# Patient Record
Sex: Female | Born: 1990 | Marital: Single | Smoking: Never smoker
Health system: Southern US, Community
[De-identification: ages and names within clinical notes are randomized; demographics above are authoritative.]

## PROBLEM LIST (undated history)

## (undated) DIAGNOSIS — K219 Gastro-esophageal reflux disease without esophagitis: Secondary | ICD-10-CM

## (undated) DIAGNOSIS — J45909 Unspecified asthma, uncomplicated: Secondary | ICD-10-CM

## (undated) DIAGNOSIS — I10 Essential (primary) hypertension: Secondary | ICD-10-CM

## (undated) HISTORY — DX: Gastro-esophageal reflux disease without esophagitis: K21.9

## (undated) HISTORY — DX: Essential (primary) hypertension: I10

## (undated) HISTORY — DX: Unspecified asthma, uncomplicated: J45.909

## (undated) HISTORY — DX: Morbid (severe) obesity due to excess calories: E66.01

---

## 2013-05-06 ENCOUNTER — Ambulatory Visit (INDEPENDENT_AMBULATORY_CARE_PROVIDER_SITE_OTHER): Payer: No Typology Code available for payment source | Admitting: Surgery

## 2013-05-06 ENCOUNTER — Encounter (INDEPENDENT_AMBULATORY_CARE_PROVIDER_SITE_OTHER): Payer: Self-pay | Admitting: Surgery

## 2013-05-06 VITALS — BP 130/76 | HR 84 | Temp 98.8°F | Resp 15 | Ht 67.0 in | Wt >= 6400 oz

## 2013-05-06 DIAGNOSIS — K219 Gastro-esophageal reflux disease without esophagitis: Secondary | ICD-10-CM | POA: Insufficient documentation

## 2013-05-06 DIAGNOSIS — I10 Essential (primary) hypertension: Secondary | ICD-10-CM | POA: Insufficient documentation

## 2013-05-06 NOTE — Progress Notes (Signed)
Chief Complaint:  Morbid obesity lifelong BMI 65  History of Present Illness:  Debra Walters is an 23 y.o. female college student currently up to protect with lifelong history of obesity. Her parents were obese and she has been obese since a child. She has had problems with the dad asthma, GERD, and hypertension. She currently takes Prilosec, albuterol, and  Ibuprofen. She has tried numerous attempts at weight loss including women's health and various [facility] programs. She's tried to low calorie diets, self-imposed fasts, high protein diets. She is prepared to go to move forward with laparoscopic Roux-en-Y gastric bypass. I have talked to her about this procedure in particular.  Past Medical History  Diagnosis Date  . Asthma   . GERD (gastroesophageal reflux disease)   . Hypertension     History reviewed. No pertinent past surgical history.  Current Outpatient Prescriptions  Medication Sig Dispense Refill  . ALBUTEROL SULFATE HFA IN Inhale into the lungs.      . fluconazole (DIFLUCAN) 150 MG tablet Take 150 mg by mouth daily.      Marland Kitchen. ibuprofen (ADVIL,MOTRIN) 200 MG tablet Take 200 mg by mouth every 6 (six) hours as needed.      Marland Kitchen. omeprazole (PRILOSEC) 20 MG capsule Take 20 mg by mouth daily.       No current facility-administered medications for this visit.   Sulfa antibiotics Family History  Problem Relation Age of Onset  . Cancer Maternal Aunt     melanoma    Social History:   reports that she has never smoked. She has never used smokeless tobacco. She reports that she drinks about 0.5 ounces of alcohol per week. She reports that she does not use illicit drugs.   REVIEW OF SYSTEMS - PERTINENT POSITIVES ONLY: No history of DVT  Physical Exam:   Blood pressure 130/76, pulse 84, temperature 98.8 F (37.1 C), temperature source Oral, resp. rate 15, height 5\' 7"  (1.702 m), weight 418 lb 12.8 oz (189.966 kg). Body mass index is 65.58 kg/(m^2).  Gen:  WDWN African American female  NAD  Neurological: Alert and oriented to person, place, and time. Motor and sensory function is grossly intact  Head: Normocephalic and atraumatic.  Eyes: Conjunctivae are normal. Pupils are equal, round, and reactive to light. No scleral icterus.  Neck: Normal range of motion. Neck supple. No tracheal deviation or thyromegaly present.  Cardiovascular:  SR without murmurs or gallops.  No carotid bruits Respiratory: Effort normal.  No respiratory distress. No chest wall tenderness. Breath sounds normal.  No wheezes, rales or rhonchi.  Abdomen:  Nontender. No prior abdominal surgery GU: Musculoskeletal: Normal range of motion. Extremities are nontender. No cyanosis, edema or clubbing noted Lymphadenopathy: No cervical, preauricular, postauricular or axillary adenopathy is present Skin: Skin is warm and dry. No rash noted. No diaphoresis. No erythema. No pallor. Pscyh: Normal mood and affect. Behavior is normal. Judgment and thought content normal.   LABORATORY RESULTS: No results found for this or any previous visit (from the past 48 hour(s)).  RADIOLOGY RESULTS: No results found.  Problem List: There are no active problems to display for this patient.   Assessment & Plan: Morbid obesity BMI 65. Plan to workup for laparoscopic Roux-en-Y gastric bypass. She denies any narcolepsy or evidence of obstructive sleep apnea.    Matt B. Daphine DeutscherMartin, MD, [facility]  [facility] [phone] beeper [phone]  05/06/2013 5:41 PM

## 2013-05-06 NOTE — Patient Instructions (Signed)

## 2013-05-11 NOTE — Addendum Note (Signed)
Addended by: Maryan PulsMOORE, Lavergne Hiltunen on: 05/11/2013 10:54 AM   Modules accepted: Orders

## 2013-05-23 ENCOUNTER — Other Ambulatory Visit (INDEPENDENT_AMBULATORY_CARE_PROVIDER_SITE_OTHER): Payer: Self-pay

## 2013-06-14 ENCOUNTER — Other Ambulatory Visit: Payer: Self-pay

## 2013-06-14 ENCOUNTER — Ambulatory Visit (HOSPITAL_COMMUNITY)
Admission: RE | Admit: 2013-06-14 | Discharge: 2013-06-14 | Disposition: A | Discharge status: Home / Self Care | Payer: No Typology Code available for payment source | Source: Ambulatory Visit | Attending: Surgery | Admitting: Surgery

## 2013-06-14 DIAGNOSIS — J45909 Unspecified asthma, uncomplicated: Secondary | ICD-10-CM | POA: Insufficient documentation

## 2013-06-14 DIAGNOSIS — K219 Gastro-esophageal reflux disease without esophagitis: Secondary | ICD-10-CM

## 2013-06-14 DIAGNOSIS — Z6841 Body Mass Index (BMI) 40.0 and over, adult: Secondary | ICD-10-CM | POA: Insufficient documentation

## 2013-06-14 DIAGNOSIS — I10 Essential (primary) hypertension: Secondary | ICD-10-CM

## 2013-06-14 DIAGNOSIS — Z13820 Encounter for screening for osteoporosis: Secondary | ICD-10-CM | POA: Insufficient documentation

## 2013-06-27 ENCOUNTER — Encounter: Payer: No Typology Code available for payment source | Attending: Surgery | Admitting: Dietician

## 2013-06-27 ENCOUNTER — Encounter: Payer: Self-pay | Admitting: Dietician

## 2013-06-27 VITALS — Ht 67.0 in | Wt >= 6400 oz

## 2013-06-27 DIAGNOSIS — E669 Obesity, unspecified: Secondary | ICD-10-CM | POA: Insufficient documentation

## 2013-06-27 DIAGNOSIS — Z6841 Body Mass Index (BMI) 40.0 and over, adult: Secondary | ICD-10-CM | POA: Insufficient documentation

## 2013-06-27 DIAGNOSIS — Z713 Dietary counseling and surveillance: Secondary | ICD-10-CM | POA: Insufficient documentation

## 2013-06-27 DIAGNOSIS — Z01818 Encounter for other preprocedural examination: Secondary | ICD-10-CM | POA: Insufficient documentation

## 2013-06-27 NOTE — Patient Instructions (Signed)
[facility] once surgery is scheduled. Start working Pre-Op goals and try protein shakes.

## 2013-06-27 NOTE — Progress Notes (Signed)
  Pre-Op Assessment Visit:  Pre-Operative RYGB Surgery  Medical Nutrition Therapy:  Appt start time: 1600   End time:  1630.  Patient was seen on 06/27/2013 for Pre-Operative RYGB Nutrition Assessment. [facility] on 06/27/2013.   Preferred Learning Style:   No preference indicated   Learning Readiness:   Ready  Handouts given during visit include:  Pre-Op Goals Bariatric Surgery Protein Shakes  Teaching Method Utilized:  Visual Auditory Hands on  Barriers to learning/adherence to lifestyle change: none  Demonstrated degree of understanding via:  Teach Back   [facility] to enroll in Pre-Op and Post-Op Nutrition Education when surgery date is scheduled.

## 2014-07-03 ENCOUNTER — Encounter: Payer: 59 | Attending: Surgery | Admitting: Dietician

## 2014-07-03 ENCOUNTER — Encounter: Payer: Self-pay | Admitting: Dietician

## 2014-07-03 DIAGNOSIS — Z713 Dietary counseling and surveillance: Secondary | ICD-10-CM | POA: Insufficient documentation

## 2014-07-03 DIAGNOSIS — Z6841 Body Mass Index (BMI) 40.0 and over, adult: Secondary | ICD-10-CM | POA: Insufficient documentation

## 2014-07-03 NOTE — Progress Notes (Signed)
  6 Months Supervised Weight Loss Visit:   Pre-Operative RYGB Surgery  Medical Nutrition Therapy:  Appt start time: 0830 end time:  0845.  Primary concerns today: Supervised Weight Loss Visit. Has gaining about 23 lbs since last year (assessment). Had a change in insurance companies and mother became ill so she stopped the process for a while. Was working chewing foods well, not drinking while eating, drinking mostly water, and tried some protein shakes. Eating 1-2 x day (sandwich, leftovers, pizza) and drinking water, diet soda, hot tea every once in awhile.   Started parking farther away in parking lots to try to get more movement. Started tracking calories on her phone. Going to school (community college) and hopes to be working over the summer. Lives with her mom and sister and she does the meal planning/food shopping.   Weight: 435.6 lbs BMI: 68.2  Preferred Learning Style:   No preference indicated   Learning Readiness:   Ready  Medications: see list  Recent physical activity:  none  Progress Towards Goal(s):  In progress.    Nutritional Diagnosis:  [place]-3.3 Obesity related to past poor dietary habits and physical inactivity as evidenced by patient attending supervised weight loss for insurance approval of bariatric surgery.    Intervention:  Nutrition counseling provided. Plan: Make sure you have the pre-op goals and protein shakes.  Aim to eat 3 meals per day. Develop a meal structure/plan ahead. If you can't eat breakfast, have a protein shake. Try to have vegetables with lunch and dinner meals (fresh or frozen).   Teaching Method Utilized:  Visual Auditory Hands on  Barriers to learning/adherence to lifestyle change: none  Demonstrated degree of understanding via:  Teach Back   Monitoring/Evaluation:  Dietary intake, exercise, and body weight. Follow up in 1 months for 6 month supervised weight loss visit.

## 2014-07-03 NOTE — Patient Instructions (Addendum)
Make sure you have the pre-op goals and protein shakes.  Aim to eat 3 meals per day. Develop a meal structure/plan ahead. If you can't eat breakfast, have a protein shake. Try to have vegetables with lunch and dinner meals (fresh or frozen).

## 2014-08-03 ENCOUNTER — Encounter: Payer: 59 | Attending: Surgery | Admitting: Dietician

## 2014-08-03 ENCOUNTER — Encounter: Payer: Self-pay | Admitting: Dietician

## 2014-08-03 DIAGNOSIS — Z713 Dietary counseling and surveillance: Secondary | ICD-10-CM | POA: Diagnosis not present

## 2014-08-03 DIAGNOSIS — Z6841 Body Mass Index (BMI) 40.0 and over, adult: Secondary | ICD-10-CM | POA: Diagnosis not present

## 2014-08-03 NOTE — Patient Instructions (Addendum)
Plan: Make sure you have the pre-op goals and protein shakes.  Aim to eat 3 meals per day. Develop a meal structure/plan ahead. If you can't eat breakfast, have a protein shake. Try to have vegetables with lunch and dinner meals (fresh or frozen).  Try a recommended protein shake   [email or web address] (fill out registration page)

## 2014-08-03 NOTE — Progress Notes (Signed)
  6 Months Supervised Weight Loss Visit:   Pre-Operative RYGB Surgery  Medical Nutrition Therapy:  Appt start time: 0835 end time:  0850.  Primary concerns today: Supervised Weight Loss Visit. Has gaining about 23 lbs since last year (assessment). Had a change in insurance companies and mother became ill so she stopped the process for a while. Was working chewing foods well, not drinking while eating, drinking mostly water, and tried some protein shakes. Eating 1-2 x day (sandwich, leftovers, pizza) and drinking water, diet soda, hot tea every once in awhile.   Started parking farther away in parking lots to try to get more movement. Started tracking calories on her phone. Going to school (community college) and hopes to be working over the summer. Lives with her mom and sister and she does the meal planning/food shopping.   Debra Walters returns for her 2nd SWL visit in preparation for RYGB having gained 2.5 pounds. She states that she is working on eating breakfast and has noticed that when she eats breakfast she eats less throughout the day and doesn't feel as hungry. She has not yet tried a protein shake that is recommended. She has been walking and doing other exercises 3-4 mornings a week. She is trying to add more vegetables (salads and veggies with hummus).   Weight: 438 lbs BMI: 68.7  Preferred Learning Style:   No preference indicated   Learning Readiness:   Ready  Medications: see list  Recent physical activity:  none  Progress Towards Goal(s):  In progress.    Nutritional Diagnosis:  [place]-3.3 Obesity related to past poor dietary habits and physical inactivity as evidenced by patient attending supervised weight loss for insurance approval of bariatric surgery.    Intervention:  Nutrition counseling provided. Plan: Make sure you have the pre-op goals and protein shakes.  Aim to eat 3 meals per day. Develop a meal structure/plan ahead. If you can't eat breakfast, have a protein  shake. Try to have vegetables with lunch and dinner meals (fresh or frozen).  Try a recommended protein shake  Teaching Method Utilized:  Visual Auditory Hands on  Barriers to learning/adherence to lifestyle change: none  Demonstrated degree of understanding via:  Teach Back   Monitoring/Evaluation:  Dietary intake, exercise, and body weight. Follow up in 1 months for 6 month supervised weight loss visit.

## 2014-09-05 ENCOUNTER — Encounter: Payer: Self-pay | Admitting: Dietician

## 2014-09-05 ENCOUNTER — Encounter: Payer: 59 | Attending: Surgery | Admitting: Dietician

## 2014-09-05 DIAGNOSIS — Z713 Dietary counseling and surveillance: Secondary | ICD-10-CM | POA: Diagnosis not present

## 2014-09-05 DIAGNOSIS — Z6841 Body Mass Index (BMI) 40.0 and over, adult: Secondary | ICD-10-CM | POA: Diagnosis not present

## 2014-09-05 NOTE — Progress Notes (Signed)
  6 Months Supervised Weight Loss Visit:   Pre-Operative RYGB Surgery  Medical Nutrition Therapy:  Appt start time: 0800 end time:  0815.  Primary concerns today: Supervised Weight Loss Visit #3. Has had not weight change since last visit. Has been eating more vegetables and trying to eat breakfast each day. Starting working out at home with sister about 3 x week. Still working on chewing foods well. Can tell fullness limit and not finishing plates. Still drinking water during meals. Drinks diet green tea and seltzer.   Has started working since last visit.   Weight: 438 lbs BMI: 68.7  Preferred Learning Style:   No preference indicated   Learning Readiness:   Ready  Medications: see list  Recent physical activity:  Works out at home 3 x week arms and leg work out  Progress Towards Goal(s):  In progress.    Nutritional Diagnosis:  [place]-3.3 Obesity related to past poor dietary habits and physical inactivity as evidenced by patient attending supervised weight loss for insurance approval of bariatric surgery.    Intervention:  Nutrition counseling provided. Plan: Work on not drinking 15 minutes before a meal, during, and up through 30 minutes after a meal.  Continue to work on chewing 20-30 x per bite.  Start looking for or trying decaf green tea.  Continue to plan out meals for the week.  Trying using small plates for meals.  Continue to have vegetables with lunch and dinner meals (fresh or frozen).  Continue to exercise at home or look into a gym membership. Look into [employer] or [employer]. Increase activity when you can.   Teaching Method Utilized:  Visual Auditory Hands on  Barriers to learning/adherence to lifestyle change: none  Demonstrated degree of understanding via:  Teach Back   Monitoring/Evaluation:  Dietary intake, exercise, and body weight. Follow up in 1 months for 6 month supervised weight loss visit.

## 2014-09-05 NOTE — Patient Instructions (Addendum)
Plan: Work on not drinking 15 minutes before a meal, during, and up through 30 minutes after a meal.  Continue to work on chewing 20-30 x per bite.  Start looking for or trying decaf green tea.  Continue to plan out meals for the week.  Trying using small plates for meals.  Continue to have vegetables with lunch and dinner meals (fresh or frozen).  Continue to exercise at home or look into a gym membership. Look into [employer] or [employer]. Increase activity when you can.

## 2014-10-03 ENCOUNTER — Encounter: Payer: Self-pay | Admitting: Dietician

## 2014-10-03 ENCOUNTER — Encounter: Payer: 59 | Attending: Surgery | Admitting: Dietician

## 2014-10-03 DIAGNOSIS — Z713 Dietary counseling and surveillance: Secondary | ICD-10-CM | POA: Insufficient documentation

## 2014-10-03 DIAGNOSIS — Z6841 Body Mass Index (BMI) 40.0 and over, adult: Secondary | ICD-10-CM | POA: Diagnosis not present

## 2014-10-03 NOTE — Progress Notes (Signed)
Sample provided and patient instructed on proper use: Premier protein shake (strawberry - qty 1) Lot#: [id number] Exp: 11/2014

## 2014-10-03 NOTE — Patient Instructions (Signed)
Plan: Work on not drinking 15 minutes before a meal, during, and up through 30 minutes after a meal.  Continue to work on chewing 20-30 x per bite.  Start looking for or trying decaf green tea.  Continue to plan out meals for the week.  Trying using small plates for meals.  Continue to have vegetables with lunch and dinner meals (fresh or frozen).  Increase activity when you can.   -Try Premier protein shake and have it for breakfast -Work on Field seismologist intake

## 2014-10-03 NOTE — Progress Notes (Signed)
  6 Months Supervised Weight Loss Visit:   Pre-Operative RYGB Surgery  Medical Nutrition Therapy:  Appt start time: 0830 end time:  0845.  Primary concerns today: Supervised Weight Loss Visit #4.  Has lost 5 pounds in the last month. Got a gym membership and goes 3x a week (1 mile on treadmill). Working more and has less time to eat. Stopped eating breakfast as consistently. Has not tried a protein shake.  Plan: -Try Premier protein shake and have it for breakfast -Work on increasing water intake  Weight: 433.3 lbs BMI: 68  Preferred Learning Style:   No preference indicated   Learning Readiness:   Ready  Medications: see list  Recent physical activity:  Works out at home 3 x week arms and leg work out  Progress Towards Goal(s):  In progress.    Nutritional Diagnosis:  [place]-3.3 Obesity related to past poor dietary habits and physical inactivity as evidenced by patient attending supervised weight loss for insurance approval of bariatric surgery.    Intervention:  Nutrition counseling provided.   Teaching Method Utilized:  Visual Auditory Hands on  Barriers to learning/adherence to lifestyle change: none  Demonstrated degree of understanding via:  Teach Back   Monitoring/Evaluation:  Dietary intake, exercise, and body weight. Follow up in 1 months for 6 month supervised weight loss visit.

## 2014-11-07 ENCOUNTER — Encounter: Payer: 59 | Attending: Surgery | Admitting: Dietician

## 2014-11-07 DIAGNOSIS — Z713 Dietary counseling and surveillance: Secondary | ICD-10-CM | POA: Diagnosis not present

## 2014-11-07 DIAGNOSIS — Z6841 Body Mass Index (BMI) 40.0 and over, adult: Secondary | ICD-10-CM | POA: Insufficient documentation

## 2014-11-07 NOTE — Progress Notes (Signed)
  6 Months Supervised Weight Loss Visit:   Pre-Operative RYGB Surgery  Medical Nutrition Therapy:  Appt start time: 0830 end time:  0845.  Primary concerns today: Supervised Weight Loss Visit #5.  Has gained less than a pound in the last month. She tried Premier protein shake and has been replacing breakfast with one instead of skipping. She is drinking mainly water and some diet soda. She has cut down significantly on sodas. She reports feeling nervous about the procedure because she has never had surgery before. She is also anxious about the way her "body will handle it" although she has worked made many lifestyle changes to prepare.   Plan: -Work on chewing thoroughly, eating slowly, and taking tiny bites -Practice not drinking while eating  Weight: 434 lbs BMI: 68.1  Preferred Learning Style:   No preference indicated   Learning Readiness:   Ready  Medications: see list  Recent physical activity:  Works out at home 3 x week arms and leg work out  Progress Towards Goal(s):  In progress.    Nutritional Diagnosis:  [place]-3.3 Obesity related to past poor dietary habits and physical inactivity as evidenced by patient attending supervised weight loss for insurance approval of bariatric surgery.    Intervention:  Nutrition counseling provided.   Teaching Method Utilized:  Visual Auditory Hands on  Barriers to learning/adherence to lifestyle change: none  Demonstrated degree of understanding via:  Teach Back   Monitoring/Evaluation:  Dietary intake, exercise, and body weight. Follow up in 1 months for 6 month supervised weight loss visit.

## 2014-11-07 NOTE — Patient Instructions (Signed)
Plan:  -Work on chewing thoroughly, eating slowly, and taking tiny bites  -Eat with your non-dominant hand  -Practice not drinking while eating  -Avoid even having a drink at the table while you're eating

## 2014-11-08 ENCOUNTER — Encounter: Payer: Self-pay | Admitting: Dietician

## 2014-12-05 ENCOUNTER — Encounter: Payer: Self-pay | Admitting: Dietician

## 2014-12-05 ENCOUNTER — Encounter: Payer: 59 | Attending: Surgery | Admitting: Dietician

## 2014-12-05 DIAGNOSIS — Z6841 Body Mass Index (BMI) 40.0 and over, adult: Secondary | ICD-10-CM | POA: Insufficient documentation

## 2014-12-05 DIAGNOSIS — Z713 Dietary counseling and surveillance: Secondary | ICD-10-CM | POA: Insufficient documentation

## 2014-12-05 NOTE — Progress Notes (Signed)
  6 Months Supervised Weight Loss Visit:   Pre-Operative RYGB Surgery  Medical Nutrition Therapy:  Appt start time: 1200 end time:  1215  Primary concerns today: Supervised Weight Loss Visit #6.  Has lost almost 4 pounds since last visit. Recently had to get another job and has not had time to go to the gym. She reports feeling ready for surgery. She thinks the easiest thing will be portions and the hardest thing will be the types of foods she will be eating after surgery. She has really been working on watching portion sizes and using a to go box at restaurants.   Plan: -Work on chewing thoroughly, eating slowly, and taking tiny bites -Practice not drinking while eating  Weight: 430.7 lbs BMI: 67.6  Preferred Learning Style:   No preference indicated   Learning Readiness:   Ready  Medications: see list  Recent physical activity:  Works out at home 3 x week arms and leg work out  Progress Towards Goal(s):  In progress.    Nutritional Diagnosis:  [place]-3.3 Obesity related to past poor dietary habits and physical inactivity as evidenced by patient attending supervised weight loss for insurance approval of bariatric surgery.    Intervention:  Nutrition counseling provided.   Teaching Method Utilized:  Visual Auditory Hands on  Barriers to learning/adherence to lifestyle change: none  Demonstrated degree of understanding via:  Teach Back   Monitoring/Evaluation:  Dietary intake, exercise, and body weight. Follow up in 1 months for 6 month supervised weight loss visit.

## 2015-09-10 ENCOUNTER — Ambulatory Visit
Admission: EM | Admit: 2015-09-10 | Discharge: 2015-09-10 | Disposition: A | Discharge status: Home / Self Care | Payer: BLUE CROSS/BLUE SHIELD | Attending: Family Medicine | Admitting: Family Medicine

## 2015-09-10 DIAGNOSIS — T192XXA Foreign body in vulva and vagina, initial encounter: Secondary | ICD-10-CM | POA: Diagnosis not present

## 2015-09-10 NOTE — ED Notes (Signed)
Tampon Applicatior stuck in vagina and it was placed around 5:00pm

## 2015-09-10 NOTE — Discharge Instructions (Signed)
°  Follow up with your primary care physician this week as needed. Return to Urgent care for new or worsening concerns.    Vaginal Foreign Body A vaginal foreign body is any object that gets stuck or left inside the vagina. Vaginal foreign bodies left in the vagina for a long time can cause irritation and infection. In most cases, symptoms go away once the vaginal foreign body is found and removed. Rarely, a foreign object can break through the walls of the vagina and cause a serious infection inside the abdomen.  CAUSES  The most common vaginal foreign bodies are:  Tampons.  Contraceptive devices.  Toilet tissue left in the vagina.  Small objects that were placed in the vagina out of curiosity and got stuck.  A result of sexual abuse. SIGNS AND SYMPTOMS  Light vaginal bleeding.  Blood-tinged vaginal fluid (discharge).  Vaginal discharge that smells bad.  Vaginal itching or burning.  Redness, swelling, or rash near the opening of the vagina.  Abdominal pain.  Fever.  Burning or frequent urination. DIAGNOSIS  Your health care provider may be able to diagnose a vaginal foreign body based on the information you provide, your symptoms, and a physical exam. Your health care provider may also perform the following tests to check for infection:  A swab of the discharge to check under a microscope for bacteria (culture).  A urine culture.  An examination of the vagina with a small, lighted scope (vaginoscopy).  Imaging tests to get a picture of the inside of your vagina, such as:  Ultrasound.  X-ray.  MRI. TREATMENT  In most cases, a vaginal foreign body can be easily removed and the symptoms usually go away very quickly. Other treatment may include:   If the vaginal foreign body is not easily removed, medicine may be given to make you go to sleep (general anesthesia) to have the object removed.  Emergency surgery may be necessary if an infection spreads through the  walls of the vagina into the abdomen (acute abdomen). This is rare.  You may need to take antibiotic medicine if you have a vaginal or urinary tract infection. HOME CARE INSTRUCTIONS   Take medicines only as directed by your health care provider.  If you were prescribed an antibiotic medicine, finish it all even if you start to feel better.  Do not have sex or use tampons until your health care provider says it is okay.  Do not douche or use vaginal rinses unless your health care provider recommends it.  Keep all follow-up visits as directed by your health care provider. This is important. SEEK MEDICAL CARE IF:  You have abdominal pain or burning pain when urinating.  You have a fever. SEEK IMMEDIATE MEDICAL CARE IF:  You have heavy vaginal bleeding or discharge.   You have very bad abdominal pain.  MAKE SURE YOU:  Understand these instructions.  Will watch your condition.  Will get help right away if you are not doing well or get worse.   This information is not intended to replace advice given to you by your health care provider. Make sure you discuss any questions you have with your health care provider.   Document Released: 07/04/2013 Document Reviewed: 07/04/2013 Elsevier Interactive Patient Education [employer]2016 [employer]

## 2015-09-10 NOTE — ED Provider Notes (Signed)
[facility]  ____________________________________________  Time seen: Approximately 8:44 PM  I have reviewed the triage vital signs and the nursing notes.   HISTORY  Chief Complaint Foreign Body in Vagina  HPI Debra Walters is a 25 y.o. female presents for the complaint of foreign body in vagina. Patient reports that she is currently in the middle of her menstrual cycle and using tampons. Patient states that tonight approximate 5 PM she inserted a tampon. Patient reports in the process of inserting the tampon, the applicator broke apart. Patient states that she was able to remove the tampon as well as the plunger portion of the tampon inserter, but reports the applicator portion that holds the tampon remained in her vagina. Patient states that she can feel the part of the tampon applicator but unable to pull it out. Patient denies any pain, vaginal discharge, vaginal odor or concerns of STDs. Declines any STD testing or laboratory evaluations. Patient reports she is not sexually active. Patient states she is here only for foreign body in vagina removal.   PCP: [facility] Patient's last menstrual period was 09/10/2015. : current  Past Medical History  Diagnosis Date  . Asthma   . GERD (gastroesophageal reflux disease)   . Hypertension     Patient Active Problem List   Diagnosis Date Noted  . Essential hypertension, benign 05/06/2013  . GERD (gastroesophageal reflux disease) 05/06/2013    History reviewed. No pertinent past surgical history.  Current Outpatient Rx  Name  Route  Sig  Dispense  Refill  .            .            .            .            States no longer taking hypertension medication, States PCP stopped when she was in high school, and monitors blood pressure.   Allergies Sulfa antibiotics  Family History  Problem Relation Age of Onset  . Cancer Maternal Aunt     melanoma   . Asthma Other   . Hyperlipidemia Other   . Hypertension  Other   . Stroke Other   . Heart disease Other   . Obesity Other     Social History Social History  Substance Use Topics  . Smoking status: Never Smoker   . Smokeless tobacco: Never Used  . Alcohol Use: 0.5 oz/week    1 drink(s) per week     Comment: monthly    Review of Systems Constitutional: No fever/chills Eyes: No visual changes. ENT: No sore throat. Cardiovascular: Denies chest pain. Respiratory: Denies shortness of breath. Gastrointestinal: No abdominal pain.  No nausea, no vomiting.  No diarrhea.  No constipation. Genitourinary: Negative for dysuria. Musculoskeletal: Negative for back pain. Skin: Negative for rash. Neurological: Negative for headaches, focal weakness or numbness.  10-point ROS otherwise negative.  ____________________________________________   PHYSICAL EXAM:  VITAL SIGNS: ED Triage Vitals  Enc Vitals Group     BP 09/10/15 2006 147/83 mmHg     Pulse Rate 09/10/15 2006 98     Resp 09/10/15 2006 18     Temp 09/10/15 2006 98.6 F (37 C)     Temp Source 09/10/15 2006 Oral     SpO2 09/10/15 2006 100 %     Weight 09/10/15 2006 430 lb (195.047 kg)     Height 09/10/15 2006  (1.702 m)     Head Cir --  Peak Flow --      Pain Score 09/10/15 2010 0     Pain Loc --      Pain Edu? --      Excl. in GC? --   Discussed monitoring blood pressure and follow up with PCP.   Constitutional: Alert and oriented. Well appearing and in no acute distress. Eyes: Conjunctivae are normal. PERRL. EOMI. Head: Atraumatic.   EarsNormal external appearance bilaterally.  Mouth/Throat: Mucous membranes are moist.   Neck: No stridor.  No cervical spine tenderness to palpation. Cardiovascular: Normal rate, regular rhythm. Grossly normal heart sounds.  Good peripheral circulation. Respiratory: Normal respiratory effort.  No retractions. Lungs CTAB. No wheezes, rales or rhonchi.  Gastrointestinal: Soft and nontender. Morbidly obese abdomen.CVA  tenderness. Pelvic: Completed with Jacki ConesLaurie RN at bedside a chaperone and assistance. Patient declined having any wet prep or swabs done. External: No rash or lesion, normal appearance. Speculum: Mild vaginal bleeding. Purple plastic cylinder shape appearing foreign body present and removed with forceps, no other foreign body visualized. No vaginal discharge visualized. Bimanual: Nontender. No cervical or adnexal tenderness. No other foreign bodies palpated. Musculoskeletal: Ambulatory with steady gait. Neurologic:  Normal speech and language. Skin:  Skin is warm, dry and intact. No rash noted. Psychiatric: Mood and affect are normal. Speech and behavior are normal.  ____________________________________________   LABS (all labs ordered are listed, but only abnormal results are displayed)  Labs Reviewed - No data to display ____________________________________________   INITIAL IMPRESSION / ASSESSMENT AND PLAN / ED COURSE  Pertinent labs & imaging results that were available during my care of the patient were reviewed by me and considered in my medical decision making (see chart for details)  .very well-appearing patient. No acute distress. Presents for complaints of foreign body in vagina. Patient reports portion of tampon inserter is retained in vagina. Denies pain or any other complaints. Patient states here only for removal of foreign object. Removal of foreign object during pelvic exam with forceps. Patient tolerated well. Denies complaints. Nontender. Patient declines wet prep or other laboratory evaluation.  Discussed follow up with Primary care physician this week. Discussed follow up and return parameters including no resolution or any worsening concerns. Patient verbalized understanding and agreed to plan.   ____________________________________________   FINAL CLINICAL IMPRESSION(S) / ED DIAGNOSES  Final diagnoses:  Foreign body in vagina, initial encounter     Discharge  Medication List as of 09/10/2015  8:34 PM      Note: This dictation was prepared with Dragon dictation along with smaller phrase technology. Any transcriptional errors that result from this process are unintentional.      Renford DillsLindsey Sindee Stucker, NP 09/10/15 2116

## 2016-06-02 ENCOUNTER — Ambulatory Visit
Admission: EM | Admit: 2016-06-02 | Discharge: 2016-06-02 | Disposition: A | Discharge status: Home / Self Care | Payer: BLUE CROSS/BLUE SHIELD | Attending: Family Medicine | Admitting: Family Medicine

## 2016-06-02 DIAGNOSIS — J219 Acute bronchiolitis, unspecified: Secondary | ICD-10-CM

## 2016-06-02 MED ORDER — HYDROCOD POLST-CPM POLST ER 10-8 MG/5ML PO SUER: 5.0000 mL | Freq: Two times a day (BID) | ORAL | 0 refills | Status: DC | PRN

## 2016-06-02 MED ORDER — AZITHROMYCIN 500 MG PO TABS: 500.0000 mg | ORAL_TABLET | Freq: Every day | ORAL | 0 refills | Status: DC

## 2016-06-02 NOTE — ED Triage Notes (Signed)
Pt started having a cough, congestion, difficulty taking a deep breath since last week. She went and saw the MD on campus and they treated her for asthma. However her symptoms are not getting any better. Stuffy nose, headache

## 2016-06-02 NOTE — ED Provider Notes (Signed)
[facility]    CSN: [id number] Arrival date & time: 06/02/16  1108     History   Chief Complaint Chief Complaint  Patient presents with  . URI    HPI Debra Walters is a 26 y.o. female.   Patient is a 26 year old African American female who was complaining of shortness of breath that started on Tuesday last week. She states that since then she's been coughing congested and some tightness in her chest. She's also had irritation of the throat from coughing as well. She was seen at the health center on Thursday she commutes to [facility] twice a week. She states that the health care professional didn't find anything significant with her apparently told her it was viral and sent home. Since Friday the sputum has gotten thicker style yellow-green she's having progressive shortness of breath and her albuterol inhaler that she's had from previous bronchospasms is not working. No previous surgery she does have a history of asthma GERD hypertension. Strong history of diabetes and hypertension on both sides she had a maternal aunt with melanoma. She's never smoker denies any chance of pregnancy she is allergic to sulfa drugs    URI  Presenting symptoms: sore throat   Associated symptoms: sinus pain and wheezing     Past Medical History:  Diagnosis Date  . Asthma   . GERD (gastroesophageal reflux disease)   . Hypertension     Patient Active Problem List   Diagnosis Date Noted  . Essential hypertension, benign 05/06/2013  . GERD (gastroesophageal reflux disease) 05/06/2013    History reviewed. No pertinent surgical history.  OB History    No data available       Home Medications    Prior to Admission medications   Medication Sig Start Date End Date Taking? Authorizing Provider  ALBUTEROL SULFATE HFA IN Inhale into the lungs.   Yes Historical Provider, MD  azithromycin (ZITHROMAX) 500 MG tablet Take 1 tablet (500 mg total) by mouth daily. 06/02/16   Hassan Rowan,  MD  chlorpheniramine-HYDROcodone [facility] PENNKINETIC ER) 10-8 MG/5ML SUER Take 5 mLs by mouth every 12 (twelve) hours as needed for cough. 06/02/16   Hassan Rowan, MD    Family History Family History  Problem Relation Age of Onset  . Cancer Maternal Aunt     melanoma   . Asthma Other   . Hyperlipidemia Other   . Hypertension Other   . Stroke Other   . Heart disease Other   . Obesity Other     Social History Social History  Substance Use Topics  . Smoking status: Never Smoker  . Smokeless tobacco: Never Used  . Alcohol use 0.5 oz/week    1 drink(s) per week     Comment: monthly     Allergies   Sulfa antibiotics   Review of Systems Review of Systems  HENT: Positive for sinus pain, sinus pressure and sore throat.   Respiratory: Positive for chest tightness, shortness of breath and wheezing.   All other systems reviewed and are negative.    Physical Exam Triage Vital Signs ED Triage Vitals [06/02/16 1140]  Enc Vitals Group     BP (!) 155/83     Pulse Rate 97     Resp 18     Temp 98.3 F (36.8 C)     Temp Source Oral     SpO2 99 %     Weight (!) 438 lb (198.7 kg)     Height  (1.702  m)     Head Circumference      Peak Flow      Pain Score 5     Pain Loc      Pain Edu?      Excl. in GC?    No data found.   Updated Vital Signs BP (!) 155/83 (BP Location: Left Arm)   Pulse 97   Temp 98.3 F (36.8 C) (Oral)   Resp 18   Ht  (1.702 m)   Wt (!) 438 lb (198.7 kg)   LMP 05/19/2016   SpO2 99%   BMI 68.60 kg/m   Visual Acuity Right Eye Distance:   Left Eye Distance:   Bilateral Distance:    Right Eye Near:   Left Eye Near:    Bilateral Near:     Physical Exam  Constitutional: She is oriented to person, place, and time. She appears well-developed and well-nourished. No distress.  HENT:  Head: Normocephalic and atraumatic.  Right Ear: External ear normal.  Left Ear: External ear normal.  Eyes: Pupils are equal, round, and reactive to  light.  Neck: Normal range of motion. Neck supple.  Cardiovascular: Normal rate and regular rhythm.   Pulmonary/Chest: Effort normal.  Musculoskeletal: Normal range of motion.  Neurological: She is alert and oriented to person, place, and time.  Skin: Skin is warm. She is not diaphoretic.  Psychiatric: She has a normal mood and affect.  Vitals reviewed.    UC Treatments / Results  Labs (all labs ordered are listed, but only abnormal results are displayed) Labs Reviewed - No data to display  EKG  EKG Interpretation None       Radiology No results found.  Procedures Procedures (including critical care time)  Medications Ordered in UC Medications - No data to display   Initial Impression / Assessment and Plan / UC Course  I have reviewed the triage vital signs and the nursing notes.  Pertinent labs & imaging results that were available during my care of the patient were reviewed by me and considered in my medical decision making (see chart for details).     We'll treat patient for bronchitis will place her on Zithromax 500 mg for 5 days offered albuterol inhaler but she apparently still has one this active and date. We'll place on Tussionex 1 teaspoon twice a day given for school for today and tomorrow. Informed patient that appears this was viral but because she's having increased trouble symptoms and is going on for weeks now we'll treat with the antibiotics  Final Clinical Impressions(s) / UC Diagnoses   Final diagnoses:  Acute bronchiolitis with bronchospasm    New Prescriptions New Prescriptions   AZITHROMYCIN (ZITHROMAX) 500 MG TABLET    Take 1 tablet (500 mg total) by mouth daily.   CHLORPHENIRAMINE-HYDROCODONE (TUSSIONEX PENNKINETIC ER) 10-8 MG/5ML SUER    Take 5 mLs by mouth every 12 (twelve) hours as needed for cough.     Note: This dictation was prepared with Dragon dictation along with smaller phrase technology. Any transcriptional errors that result  from this process are unintentional.   Hassan Rowan, MD 06/02/16 1256

## 2016-07-30 ENCOUNTER — Ambulatory Visit
Admission: EM | Admit: 2016-07-30 | Discharge: 2016-07-30 | Disposition: A | Discharge status: Home / Self Care | Payer: BLUE CROSS/BLUE SHIELD | Attending: Family Medicine | Admitting: Family Medicine

## 2016-07-30 ENCOUNTER — Encounter: Payer: Self-pay | Admitting: Emergency Medicine

## 2016-07-30 DIAGNOSIS — B9689 Other specified bacterial agents as the cause of diseases classified elsewhere: Secondary | ICD-10-CM | POA: Diagnosis not present

## 2016-07-30 DIAGNOSIS — N760 Acute vaginitis: Secondary | ICD-10-CM | POA: Diagnosis not present

## 2016-07-30 DIAGNOSIS — R220 Localized swelling, mass and lump, head: Secondary | ICD-10-CM

## 2016-07-30 DIAGNOSIS — N390 Urinary tract infection, site not specified: Secondary | ICD-10-CM | POA: Diagnosis not present

## 2016-07-30 LAB — WET PREP, GENITAL
Sperm: NONE SEEN
Trich, Wet Prep: NONE SEEN
YEAST WET PREP: NONE SEEN

## 2016-07-30 LAB — URINALYSIS, COMPLETE (UACMP) WITH MICROSCOPIC
Glucose, UA: NEGATIVE mg/dL
Ketones, ur: NEGATIVE mg/dL
LEUKOCYTES UA: NEGATIVE
Nitrite: NEGATIVE
Specific Gravity, Urine: >1.03 — ABNORMAL HIGH (ref 1.005–1.030)
pH: 5.5 (ref 5.0–8.0)

## 2016-07-30 LAB — PREGNANCY, URINE: PREG TEST UR: NEGATIVE

## 2016-07-30 LAB — CHLAMYDIA/NGC RT PCR (ARMC ONLY)
Chlamydia Tr: NOT DETECTED
N GONORRHOEAE: NOT DETECTED

## 2016-07-30 MED ORDER — FLUCONAZOLE 150 MG PO TABS: 150.0000 mg | ORAL_TABLET | Freq: Every day | ORAL | 0 refills | Status: DC

## 2016-07-30 MED ORDER — METRONIDAZOLE 500 MG PO TABS: 500.0000 mg | ORAL_TABLET | Freq: Two times a day (BID) | ORAL | 0 refills | Status: DC

## 2016-07-30 MED ORDER — NITROFURANTOIN MONOHYD MACRO 100 MG PO CAPS: 100.0000 mg | ORAL_CAPSULE | Freq: Two times a day (BID) | ORAL | 0 refills | Status: DC

## 2016-07-30 MED ORDER — PREDNISONE 10 MG PO TABS: ORAL_TABLET | ORAL | 0 refills | Status: DC

## 2016-07-30 NOTE — ED Provider Notes (Signed)
[facility] ____________________________________________  Time seen: Approximately 4:43 PM  I have reviewed the triage vital signs and the nursing notes.   HISTORY  Chief Complaint Oral Swelling and Vaginal Itching   HPI Debra Walters is a 26 y.o. female present for evaluation of vaginal itching, vaginal discharge for the last 3 weeks. Patient states that she recently completed oral antibiotic for bronchitis and states that she fully she may have developed a yeast infection. States she tried over-the-counter Monistat without any resolution. Reports not currently sexually active. Denies concerns of STDs. Denies abnormal vaginal bleeding, pelvic pain or abdominal or back pain. Denies fevers. Reports continues to eat and drink well. Reports some urinary frequency and urgency, denies burning with urination. Denies vaginal discomfort or pain at this time.  Patient also states for the last 2 days she has had some sensation that her lips are dry and swollen. States it is both lips equally. Patient states that she is worried she may have had an allergic reaction. Patient states she woke up with this complaint on Monday. Denies any changes in foods, medicines, lotions, detergents, contacts, makeup or the palpitations. Patient reports tried over-the-counter Benadryl without change. Patient states that her lips primarily fill dry and irritated, but reports do somewhat feel swollen. Denies any tongue, mouth, oral or throat swelling sensation. Denies any difficulty breathing or swallowing. Denies known trigger. Denies previous sensation. Denies pain. Denies any other skin changes. Reports does typically have dry skin.  Denies chest pain, shortness of breath, abdominal pain, extremity pain, extremity swelling or rash. Denies recent sickness. Denies recent antibiotic use.   Patient's last menstrual period was 07/16/2016 (approximate). Denies pregnancy.   Past Medical History:  Diagnosis  Date  . Asthma   . GERD (gastroesophageal reflux disease)   . Hypertension     Patient Active Problem List   Diagnosis Date Noted  . Essential hypertension, benign 05/06/2013  . GERD (gastroesophageal reflux disease) 05/06/2013    History reviewed. No pertinent surgical history.   No current facility-administered medications for this encounter.   Current Outpatient Prescriptions:  .  ALBUTEROL SULFATE HFA IN, Inhale into the lungs., Disp: , Rfl:  .  fluconazole (DIFLUCAN) 150 MG tablet, Take 1 tablet (150 mg total) by mouth daily. Take one pill orally once., Disp: 1 tablet, Rfl: 0 .  metroNIDAZOLE (FLAGYL) 500 MG tablet, Take 1 tablet (500 mg total) by mouth 2 (two) times daily., Disp: 14 tablet, Rfl: 0 .  nitrofurantoin, macrocrystal-monohydrate, (MACROBID) 100 MG capsule, Take 1 capsule (100 mg total) by mouth 2 (two) times daily., Disp: 10 capsule, Rfl: 0 .  predniSONE (DELTASONE) 10 MG tablet, Start 60 mg po day one, then 50 mg po day two, taper by 10 mg daily until complete., Disp: 21 tablet, Rfl: 0  Allergies Sulfa antibiotics  Family History  Problem Relation Age of Onset  . Cancer Maternal Aunt        melanoma   . Asthma Other   . Hyperlipidemia Other   . Hypertension Other   . Stroke Other   . Heart disease Other   . Obesity Other     Social History Social History  Substance Use Topics  . Smoking status: Never Smoker  . Smokeless tobacco: Never Used  . Alcohol use 0.5 oz/week    1 drink(s) per week     Comment: monthly    Review of Systems Constitutional: No fever/chills Eyes: No visual changes. ENT: No sore throat. Cardiovascular: Denies chest pain.  Respiratory: Denies shortness of breath. Gastrointestinal: No abdominal pain.  No nausea, no vomiting.  No diarrhea.  No constipation. Genitourinary: Positive for dysuria. Musculoskeletal: Negative for back pain. Skin: Negative for rash. As  above.   ____________________________________________   PHYSICAL EXAM:  VITAL SIGNS: ED Triage Vitals  Enc Vitals Group     BP 07/30/16 1336 (!) 143/84     Pulse Rate 07/30/16 1336 100     Resp 07/30/16 1336 16     Temp 07/30/16 1336 98.2 F (36.8 C)     Temp Source 07/30/16 1336 Oral     SpO2 07/30/16 1336 99 %     Weight 07/30/16 1334 (!) 430 lb (195 kg)     Height 07/30/16 1334 5\' 7"  (1.702 m)     Head Circumference --      Peak Flow --      Pain Score 07/30/16 1334 0     Pain Loc --      Pain Edu? --      Excl. in GC? --     Constitutional: Alert and oriented. Well appearing and in no acute distress. Eyes: Conjunctivae are normal. PERRL. EOMI. ENT      Head: Normocephalic and atraumatic.No facial swelling noted.      Ears: No erythema, normal TMs bilaterally.      Nose: No congestion/rhinnorhea.      Mouth/Throat: Mucous membranes are moist.Oropharynx non-erythematous. No tonsillar swelling. No erythema or exudate. No tongue or oral swelling noted. Upper and lower lips appear slightly dry, no cracking, no erythema, no drainage, no distinct edema noted. Neck:   No stridor. Supple without meningismus.  Hematological/Lymphatic/Immunilogical: No cervical lymphadenopathy. Cardiovascular: Normal rate, regular rhythm. Grossly normal heart sounds.  Good peripheral circulation. Respiratory: Normal respiratory effort without tachypnea nor retractions. Breath sounds are clear and equal bilaterally. No wheezes, rales, rhonchi. Gastrointestinal: Soft and nontender.obese abdomen  Pelvic : Exam completed with Herbert Seta RN at bedside a chaperone. External : Normal appearance, no rash or lesion. Speculum : Moderate amount of thick whitish vaginal discharge, no bleeding, cervical os closed, no foreign body. Bimanual : Nontender, no cervical or adnexal tenderness.   Musculoskeletal:  No midline cervical, thoracic or lumbar tenderness to palpation.  steady gait  Neurologic:  Normal speech and  language. Speech is normal. No gait instability.  Skin:  Skin is warm, dry Psychiatric: Mood and affect are normal. Speech and behavior are normal. Patient exhibits appropriate insight and judgment   ___________________________________________   LABS (all labs ordered are listed, but only abnormal results are displayed)  Labs Reviewed  WET PREP, GENITAL - Abnormal; Notable for the following:       Result Value   Clue Cells Wet Prep HPF POC PRESENT (*)    WBC, Wet Prep HPF POC FEW (*)    All other components within normal limits  URINALYSIS, COMPLETE (UACMP) WITH MICROSCOPIC - Abnormal; Notable for the following:    APPearance CLOUDY (*)    Specific Gravity, Urine >1.030 (*)    Hgb urine dipstick TRACE (*)    Bilirubin Urine SMALL (*)    Protein, ur TRACE (*)    Squamous Epithelial / LPF TOO NUMEROUS TO COUNT (*)    Bacteria, UA RARE (*)    All other components within normal limits  CHLAMYDIA/NGC RT PCR ([facility] ONLY)  URINE CULTURE  PREGNANCY, URINE     PROCEDURES Procedures    INITIAL IMPRESSION / ASSESSMENT AND PLAN / ED COURSE  Pertinent labs &  imaging results that were available during my care of the patient were reviewed by me and considered in my medical decision making (see chart for details).  Well-appearing patient. No acute distress. Urinalysis reviewed, suspect UTI, will treat with oral Macrobid and culture urine. Wet prep positive for bacterial vaginosis. Will treat with oral Flagyl. Also Diflucan given as patient expressed concern of developing yeast infection from antibiotics. Discussed no alcohol use with antibiotic. Also discussed with patient suspect lips with dry chapped sensation, no clear indication of allergic reaction. Patient continues to express concern of allergic reaction. Discussed with patient risks and benefits and will empirically treat with oral prednisone, continue home Benadryl and monitoring. Monitor for triggers. Encouraged keeping lips  hydrated, water intake and follow-up as needed. No appearance of angioedema.Discussed indication, risks and benefits of medications with patient.  Discussed follow up with Primary care physician this week. Discussed follow up and return parameters including no resolution or any worsening concerns. Patient verbalized understanding and agreed to plan.   ____________________________________________   FINAL CLINICAL IMPRESSION(S) / ED DIAGNOSES  Final diagnoses:  Bacterial vaginosis  Urinary tract infection without hematuria, site unspecified  Lip swelling     Discharge Medication List as of 07/30/2016  4:22 PM    START taking these medications   Details  fluconazole (DIFLUCAN) 150 MG tablet Take 1 tablet (150 mg total) by mouth daily. Take one pill orally once., Starting Wed 07/30/2016, Normal    metroNIDAZOLE (FLAGYL) 500 MG tablet Take 1 tablet (500 mg total) by mouth 2 (two) times daily., Starting Wed 07/30/2016, Normal    nitrofurantoin, macrocrystal-monohydrate, (MACROBID) 100 MG capsule Take 1 capsule (100 mg total) by mouth 2 (two) times daily., Starting Wed 07/30/2016, Normal    predniSONE (DELTASONE) 10 MG tablet Start 60 mg po day one, then 50 mg po day two, taper by 10 mg daily until complete., Normal        Note: This dictation was prepared with Dragon dictation along with smaller phrase technology. Any transcriptional errors that result from this process are unintentional.         Renford Dills, NP 07/30/16 1658

## 2016-07-30 NOTE — ED Triage Notes (Signed)
Patient c/o swelling and tingling in her lips that started 2 days ago.  Patient also reports vaginal itching and discharged off and on for couple of weeks.  Patient states that she was previously on an antibiotic and tried to treat her yeast infection at home.

## 2016-07-30 NOTE — Discharge Instructions (Signed)
Take medication as prescribed. Rest. Drink plenty of fluids. Do not drink alcohol with antibiotics.   Follow up with your primary care physician this week as needed. Return to Urgent care for new or worsening concerns.

## 2016-08-01 LAB — URINE CULTURE

## 2016-10-24 ENCOUNTER — Other Ambulatory Visit (HOSPITAL_COMMUNITY): Payer: Self-pay | Admitting: Surgery

## 2016-10-24 ENCOUNTER — Other Ambulatory Visit: Payer: Self-pay | Admitting: Surgery

## 2016-10-27 ENCOUNTER — Other Ambulatory Visit: Payer: Self-pay | Admitting: Surgery

## 2016-10-30 ENCOUNTER — Encounter: Payer: BLUE CROSS/BLUE SHIELD | Attending: Surgery | Admitting: Registered"

## 2016-10-30 ENCOUNTER — Encounter: Payer: Self-pay | Admitting: Registered"

## 2016-10-30 DIAGNOSIS — E669 Obesity, unspecified: Secondary | ICD-10-CM

## 2016-10-30 DIAGNOSIS — Z713 Dietary counseling and surveillance: Secondary | ICD-10-CM | POA: Insufficient documentation

## 2016-10-30 NOTE — Progress Notes (Signed)
Pre-Op Assessment Visit:  Pre-Operative Sleeve Gastrectomy Surgery  Medical Nutrition Therapy:  Appt start time: 10:45  End time:  11:30  Patient was seen on 10/30/2016 for Pre-Operative Nutrition Assessment. Assessment and [facility] coordinator on 10/30/2016.   Pt expectation of surgery: prevent health issues, help with building self-confidence with applying for certain jobs  Pt expectation of Dietitian: none stated  Start weight at [facility]: 450.9 BMI: 70.62   Pt states stays home and wants the confidence and freedom to be herself after surgery. 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   Per insurance, pt needs 4 SWL visits prior to surgery.    24 hr Dietary Recall: First Meal: skips Snack: skips Second Meal: sometimes skips; cheese, grapes, and yogurt or hot pockets or fat food Snack: none Third Meal: 2 hot pockets Snack: cereal and mini-sized chocolate Beverages: tea, ginger ale/sierra mist, juice, water with flavor packs  Encouraged to engage in 150 minutes of moderate physical activity including cardiovascular and weight baring weekly  Handouts given during visit include:  . Pre-Op Goals . Bariatric Surgery Protein Shakes . Vitamin and Mineral Recommendations  During the appointment today the following Pre-Op Goals were reviewed with the patient: . Maintain or lose weight as instructed by your surgeon . Make healthy food choices . Begin to limit portion sizes . Limited concentrated sugars and fried foods . Keep fat/sugar in the single digits per serving on          food labels . Practice CHEWING your food  (aim for 30 chews per bite or until applesauce consistency) . Practice not drinking 15 minutes before, during, and 30 minutes after each meal/snack . Avoid all carbonated beverages  . Avoid/limit caffeinated beverages  . Avoid all sugar-sweetened beverages . Consume 3 meals per day;  eat every 3-5 hours . Make a list of non-food related activities . Aim for 64-100 ounces of FLUID daily  . Aim for at least 60-80 grams of PROTEIN daily . Look for a liquid protein source that contain ?15 g protein and ?5 g carbohydrate  (ex: shakes, drinks, shots) . Physical activity is an important part of a healthy lifestyle so keep it moving!  Follow diet recommendations listed below Energy and Macronutrient Recommendations: Calories: 1800 Carbohydrate: 200 Protein: 135 Fat: 50  Demonstrated degree of understanding via:  Teach Back   Teaching Method Utilized:  Visual Auditory Hands on  Barriers to learning/adherence to lifestyle change: none  Patient to call the [facility] to enroll in Pre-Op and Post-Op Nutrition Education when surgery date is scheduled.

## 2016-11-10 ENCOUNTER — Ambulatory Visit
Admission: RE | Admit: 2016-11-10 | Discharge: 2016-11-10 | Disposition: A | Discharge status: Home / Self Care | Payer: BLUE CROSS/BLUE SHIELD | Source: Ambulatory Visit | Attending: Surgery | Admitting: Surgery

## 2016-11-10 ENCOUNTER — Other Ambulatory Visit: Payer: Self-pay | Admitting: Surgery

## 2016-11-10 DIAGNOSIS — K449 Diaphragmatic hernia without obstruction or gangrene: Secondary | ICD-10-CM | POA: Insufficient documentation

## 2016-11-10 DIAGNOSIS — R918 Other nonspecific abnormal finding of lung field: Secondary | ICD-10-CM | POA: Diagnosis not present

## 2016-11-25 ENCOUNTER — Encounter: Payer: BLUE CROSS/BLUE SHIELD | Attending: Surgery | Admitting: Registered"

## 2016-11-25 ENCOUNTER — Encounter: Payer: Self-pay | Admitting: Registered"

## 2016-11-25 DIAGNOSIS — Z713 Dietary counseling and surveillance: Secondary | ICD-10-CM | POA: Insufficient documentation

## 2016-11-25 DIAGNOSIS — E669 Obesity, unspecified: Secondary | ICD-10-CM

## 2016-11-25 NOTE — Patient Instructions (Addendum)
-   Continue to reduce carbonation intake.   - Look into orientation class at [employer] on Fri 9/28 1-1:30pm.   - Increase physical activity 20-30 min, 3 times/week.   - Practice chewing at least 30 times per bite or to applesauce consistency.

## 2016-11-25 NOTE — Progress Notes (Signed)
Appt start time: 11:25 end time: 11:45  Assessment: 1st SWL Appointment.   Start Wt at [facility]: 450.9 Wt: 453.3 BMI: 71.00   Pt arrives having gained 2.4 lbs from previous visit.   Pt states she has been reducing carbonation intake and substituting with crystal light packs in water. Pt states she is drinking 4-6 bottles (16.9 oz) of water daily; 64+ fluid ounces. Pt states she has been doing well with getting 3 meals a day and it has now created a daily routine. 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 Pt states she    MEDICATIONS: See list   DIETARY INTAKE:  24-hr recall:  B (10 AM): protein shakes  Snk (12 PM): string cheese or yogurt  L (2 PM): Chicfila-chicken sandwich/nuggets, fries, diet soda Snk (4 PM): string cheese D (8 PM): hot pockets Snk ( PM): none Beverages: ginger ale, sparkling water, crystal light  Usual physical activity: none stated   Diet to Follow: 2000 calories 225 g carbohydrates 150 g protein 56 g fat  Preferred Learning Style:   No preference indicated   Learning Readiness:   Ready  Change in progress     Nutritional Diagnosis:  [place]-3.3 Overweight/obesity related to past poor dietary habits and physical inactivity as evidenced by patient w/ planned sleeve gastrectomy surgery following dietary guidelines for continued weight loss.    Intervention:  Nutrition counseling for upcoming Bariatric Surgery.   Goals:  - Continue to reduce carbonation intake.  - Look into orientation class at [employer] on Fri 9/28 1-1:30pm.  - Increase physical activity 20-30 min, 3 times/week.  - Practice chewing at least 30 times per bite or to applesauce consistency.    Teaching Method Utilized:  Visual Auditory Hands on  Handouts given during visit include:  none  Barriers to learning/adherence to lifestyle change: none  Demonstrated degree of understanding via:  Teach Back   Monitoring/Evaluation:   Dietary intake, exercise, and body weight in 1 month(s).

## 2016-12-23 ENCOUNTER — Encounter: Payer: Self-pay | Admitting: Skilled Nursing Facility1

## 2016-12-23 ENCOUNTER — Ambulatory Visit: Payer: BLUE CROSS/BLUE SHIELD | Admitting: Registered"

## 2016-12-23 ENCOUNTER — Encounter: Payer: BLUE CROSS/BLUE SHIELD | Attending: Surgery | Admitting: Skilled Nursing Facility1

## 2016-12-23 DIAGNOSIS — E669 Obesity, unspecified: Secondary | ICD-10-CM

## 2016-12-23 DIAGNOSIS — Z713 Dietary counseling and surveillance: Secondary | ICD-10-CM | POA: Insufficient documentation

## 2016-12-23 NOTE — Progress Notes (Signed)
Appt start time: 11:25 end time: 11:45  Sleev eGatrecomy   Assessment: 2nd SWL Appointment.   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 Pt states she   Pt arrives having lost 6 pounds. Pt states this month has been really good better than any other month cutting out soda and candy replacing with fruit. Pt states she went to [employer] orientation but realized she was not comfortable there so [facility]. Pt states he had a bad habit of not eating breakfast but has now incorporated a protein shake and has also been cooking dinner at home instead of eating out.  Pt states in high school she tried fasting often and eating when people are not around so she does not want to repeat that behavior. Pt states she has been working on loving herself. Pt states she has a problem with not wanting people to judge her and does not ever want to be the center of attention: pt states she used to turn down the music at stop lights but not has Stopped turning down the music. Pt states she has been working on loving herself.   MEDICATIONS: See list   DIETARY INTAKE:  24-hr recall:  B (10 AM): protein shakes  Snk (12 PM): string cheese or yogurt  L (2 PM): Chicfila-chicken sandwich/nuggets, fries, diet soda Snk (4 PM): string cheese D (8 PM): pot roast and carrots and potatoes or tacos or manicotti with salad Snk ( PM): none Beverages: ginger ale, sparkling water, crystal light  Usual physical activity: none started yet  Diet to Follow: 2000 calories 225 g carbohydrates 150 g protein 56 g fat  Preferred Learning Style:   No preference indicated   Learning Readiness:   Ready  Change in progress     Nutritional Diagnosis:  [place]-3.3 Overweight/obesity related to past poor dietary habits and physical inactivity as evidenced by patient w/ planned sleeve gastrectomy surgery following  dietary guidelines for continued weight loss.    Intervention:  Nutrition counseling for upcoming Bariatric Surgery.   Goals:  -Keep up the great work!   Teaching Method Utilized:  Visual Auditory Hands on  Handouts given during visit include:  none  Barriers to learning/adherence to lifestyle change: none  Demonstrated degree of understanding via:  Teach Back   Monitoring/Evaluation:  Dietary intake, exercise, and body weight in 1 month(s).

## 2016-12-25 ENCOUNTER — Ambulatory Visit: Payer: BLUE CROSS/BLUE SHIELD | Admitting: Registered"

## 2017-01-20 ENCOUNTER — Encounter: Payer: Self-pay | Admitting: Registered"

## 2017-01-20 ENCOUNTER — Encounter: Payer: BLUE CROSS/BLUE SHIELD | Attending: Surgery | Admitting: Registered"

## 2017-01-20 DIAGNOSIS — E669 Obesity, unspecified: Secondary | ICD-10-CM

## 2017-01-20 DIAGNOSIS — Z713 Dietary counseling and surveillance: Secondary | ICD-10-CM | POA: Insufficient documentation

## 2017-01-20 NOTE — Progress Notes (Signed)
Appt start time: 11:25 end time: 11:43   Sleeve Gastrectomy   Assessment: 3rd SWL Appointment.   Start Wt at [facility]: 450.9 Wt: 441.6 BMI: 69.15    Pt arrives having lost 6 pounds. Pt states she has been busy recently due to moving and she has also picked up a second job. Pt states she is struggling with chewing 30 times per bite because she forgets 75% of the time. Pt states she thinks it is a texture thing. Pt states she has reduced sweet intake but has not totally eliminated it because she knows that she will not be able to have it after surgery. Pt states she just needs "the bubbles" with her drinks sometimes, but knows that she will not be able to have carbonation for a while after surgery. Pt [facility] but its more affordable to attend [employer]. Pt states she is ok with that. Pt states there is a 1.5 mile walking trail at her job that's accessible.   Pt states this month has been really good better than any other month cutting out soda and candy replacing with fruit. Pt states she went to [employer] orientation but realized she was not comfortable there so now [facility]. Pt states he had a bad habit of not eating breakfast but has now incorporated a protein shake and has also been cooking dinner at home instead of eating out.  Pt states in high school she tried fasting often and eating when people are not around so she does not want to repeat that behavior. Pt states she has been working on loving herself. Pt states she has a problem with not wanting people to judge her and does not ever want to be the center of attention: pt states she used to turn down the music at stop lights but not has Stopped turning down the music. Pt states she has been working on loving herself.   MEDICATIONS: See list   DIETARY INTAKE:  24-hr recall:  B (10 AM): protein shakes  Snk (12 PM): string cheese or yogurt  L (2 PM): Chicfila-chicken  sandwich/nuggets, fries, diet soda Snk (4 PM): string cheese D (8 PM): pot roast and carrots and potatoes or tacos or manicotti with salad Snk ( PM): none Beverages: sparkling water, crystal light, juice  Usual physical activity: moving  Diet to Follow: 2000 calories 225 g carbohydrates 150 g protein 56 g fat  Preferred Learning Style:   No preference indicated   Learning Readiness:   Ready  Change in progress     Nutritional Diagnosis:  [place]-3.3 Overweight/obesity related to past poor dietary habits and physical inactivity as evidenced by patient w/ planned sleeve gastrectomy surgery following dietary guidelines for continued weight loss.    Intervention:  Nutrition counseling for upcoming Bariatric Surgery.   Goals:  - Increase physical activity 20-30 min, 3 times/week: walking trail at work. - Continue to practice chewing at least 30 times per bite or to applesauce consistency. - Reduce juice intake.   Teaching Method Utilized:  Visual Auditory Hands on  Handouts given during visit include:  none  Barriers to learning/adherence to lifestyle change: none  Demonstrated degree of understanding via:  Teach Back   Monitoring/Evaluation:  Dietary intake, exercise, and body weight in 1 month(s).

## 2017-01-20 NOTE — Patient Instructions (Addendum)
-   Increase physical activity 20-30 min, 3 times/week: walking trail at work.  - Continue to practice chewing at least 30 times per bite or to applesauce consistency.  - Reduce juice intake.

## 2017-02-16 ENCOUNTER — Ambulatory Visit: Payer: BLUE CROSS/BLUE SHIELD | Admitting: Registered"

## 2017-02-17 ENCOUNTER — Encounter: Payer: BLUE CROSS/BLUE SHIELD | Attending: Surgery | Admitting: Registered"

## 2017-02-17 ENCOUNTER — Encounter: Payer: Self-pay | Admitting: Registered"

## 2017-02-17 DIAGNOSIS — Z713 Dietary counseling and surveillance: Secondary | ICD-10-CM | POA: Insufficient documentation

## 2017-02-17 NOTE — Progress Notes (Signed)
Appt start time: 11:35 end time: 12:05 Sleeve Gastrectomy   Assessment: 4th SWL Appointment.   Start Wt at [facility]: 450.9 Wt: 439.6 BMI: 68.85   Pt arrives having lost 2 pounds from previous visit. Pt states she has not been able to go to the gym and doesn't know why. Pt states she tries to park further away at work and tries to take more steps at home. Pt states chewing is so hard. Pt states she drinks lemonade on occasions at restaurants. Pt states she is drinking 64 ounces of fluid a day. Pt states she is doing better with not drinking with her meals. Pt states she likes multiple premier shake flavors. Pt states her insurance changes in Jan and unsure of how things will proceed forward. Pt states she will contact [facility] with questions and concerns.   Pt states she has been busy recently due to moving and she has also picked up a second job. Pt [facility] but its more affordable to attend [employer]. Pt states she is ok with that. Pt states there is a 1.5 mile walking trail at her job that's accessible for walking while at work.   Pt states this month has been really good better than any other month cutting out soda and candy replacing with fruit. Pt states she went [facility] [employer] [facility] but realized she was not comfortable there so now [facility]. Pt states he had a bad habit of not eating breakfast but has now incorporated a protein shake and has also been cooking dinner at home instead of eating out.  Pt states in high school she tried fasting often and eating when people are not around so she does not want to repeat that behavior. Pt states she has been working on loving herself. Pt states she has a problem with not wanting people to judge her and does not ever want to be the center of attention: pt states she used to turn down the music at stop lights but not has Stopped turning down the music. Pt states she has been working on  loving herself.   MEDICATIONS: See list   DIETARY INTAKE:  24-hr recall:  B (10 AM): protein shakes  Snk (12 PM): string cheese or yogurt  L (2 PM): Chicfila-chicken sandwich/nuggets, fries, diet soda Snk (4 PM): string cheese D (8 PM): pot roast and carrots and potatoes or tacos or manicotti with salad Snk ( PM): none Beverages: sparkling water, crystal light, juice, lemonade (sometimes)  Usual physical activity: moving  Diet to Follow: 2000 calories 225 g carbohydrates 150 g protein 56 g fat  Preferred Learning Style:   No preference indicated   Learning Readiness:   Ready  Change in progress     Nutritional Diagnosis:  [place]-3.3 Overweight/obesity related to past poor dietary habits and physical inactivity as evidenced by patient w/ planned sleeve gastrectomy surgery following dietary guidelines for continued weight loss.    Intervention:  Nutrition counseling for upcoming Bariatric Surgery.   Goals:  - Increase physical activity to 20-30 min with gym on Mondays and brisk walking on work days.   - Try putting fork down between bites when eating to help with chewing 30 times per bite. - Try diet/low sugar versions of lemonade. - Aim to have water at fast food restaurants.  - Aim to not drink 15 minutes before eating, not while eating, and waiting 30 minutes after  eating.    Teaching Method Utilized:  Visual Auditory Hands on  Handouts given during visit include:  none  Barriers to learning/adherence to lifestyle change: none  Demonstrated degree of understanding via:  Teach Back   Monitoring/Evaluation:  Dietary intake, exercise, and body weight prn.

## 2017-02-17 NOTE — Patient Instructions (Addendum)
-   Increase physical activity to 20-30 min with gym on Mondays and brisk walking on work days.    - Try putting fork down between bites when eating to help with chewing 30 times per bite.  - Try diet/low sugar versions of lemonade.  - Aim to have water at fast food restaurants.   - Aim to not drink 15 minutes before eating, not while eating, and waiting 30 minutes after eating.

## 2017-03-16 ENCOUNTER — Encounter: Payer: Self-pay | Attending: Surgery | Admitting: Skilled Nursing Facility1

## 2017-03-16 DIAGNOSIS — Z713 Dietary counseling and surveillance: Secondary | ICD-10-CM | POA: Insufficient documentation

## 2017-03-16 DIAGNOSIS — E669 Obesity, unspecified: Secondary | ICD-10-CM

## 2017-03-18 ENCOUNTER — Encounter: Payer: Self-pay | Admitting: Skilled Nursing Facility1

## 2017-03-18 NOTE — Progress Notes (Signed)
Pre-Operative Nutrition Class:  Appt start time: 4436   End time:  1830.  Patient was seen on 03/16/2017 for Pre-Operative Bariatric Surgery Education [facility].   Surgery date:  Surgery type: sleeve [facility] weight at [facility]: 450.9 Weight today: 440.6  Samples given per MNT protocol. Patient educated on appropriate usage: Bariatric Advantage Multivitamin Lot # [id number] Exp: 08/2017  Bariatric Advantage Calcium  Lot # [id number] Exp:11-08-17  [place] Protein Shake Lot # [id number] Exp: 12-13-17  The following the learning objectives were met by the patient during this course:  Identify Pre-Op Dietary Goals and will begin 2 weeks pre-operatively  Identify appropriate sources of fluids and proteins   State protein recommendations and appropriate sources pre and post-operatively  Identify Post-Operative Dietary Goals and will follow for 2 weeks post-operatively  Identify appropriate multivitamin and calcium sources  Describe the need for physical activity post-operatively and will follow MD recommendations  State when to call healthcare provider regarding medication questions or post-operative complications  Handouts given during class include:  Pre-Op Bariatric Surgery Diet Handout  Protein Shake Handout  Post-Op Bariatric Surgery Nutrition Handout  BELT Program Information Flyer  Support Group Information Flyer  [facility] Bariatric Supplements Price List  [place]: [place] per MD.

## 2019-03-15 IMAGING — CR DG CHEST 2V
2 series · 2 of 2 positions shown · non-contrast
Comparison: Chest x-ray of June 14, 2013.

CLINICAL DATA: Morbid obesity, preoperative evaluation. History of
asthma, hypertension, gastroesophageal reflux.

EXAM:
CHEST  2 VIEW

[chest pa]
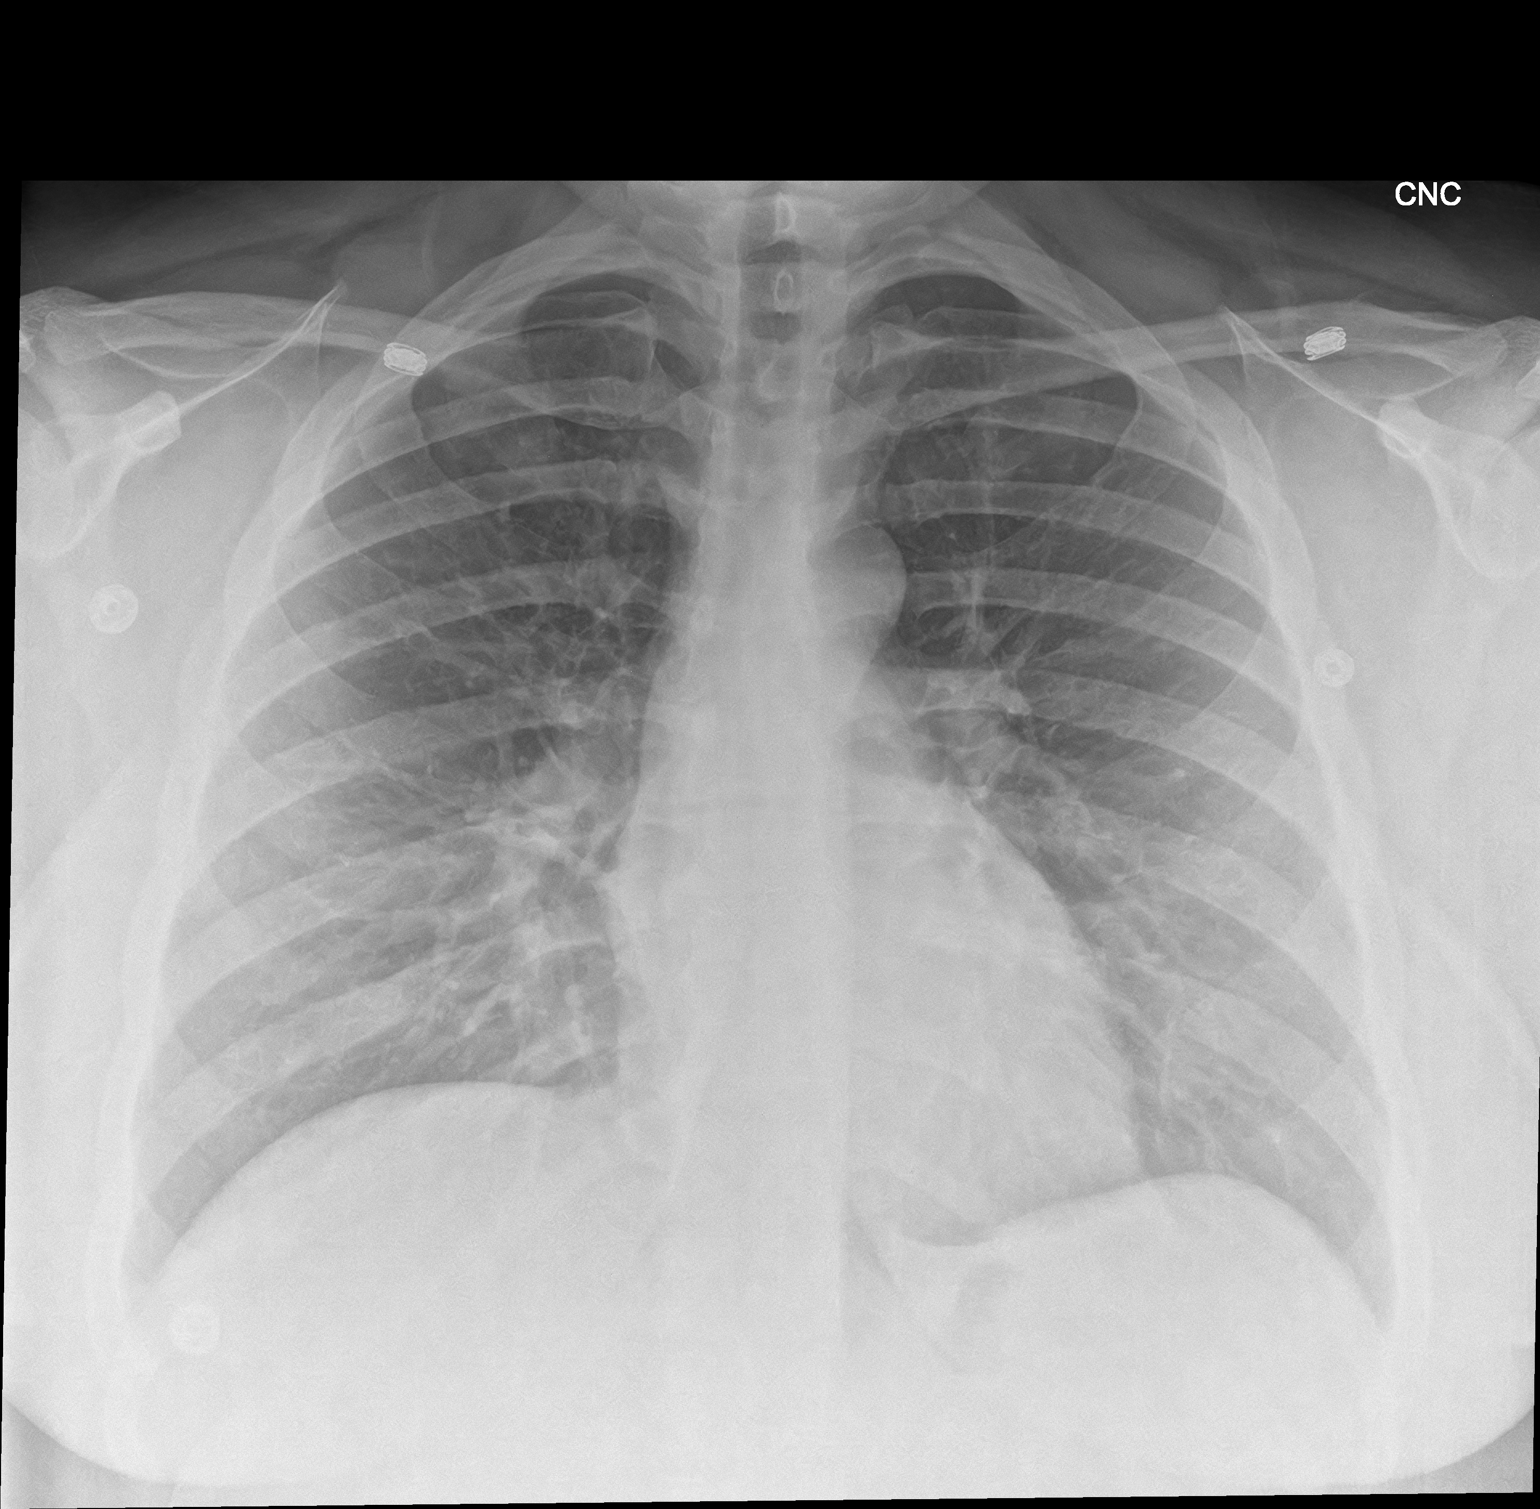

[chest lat]
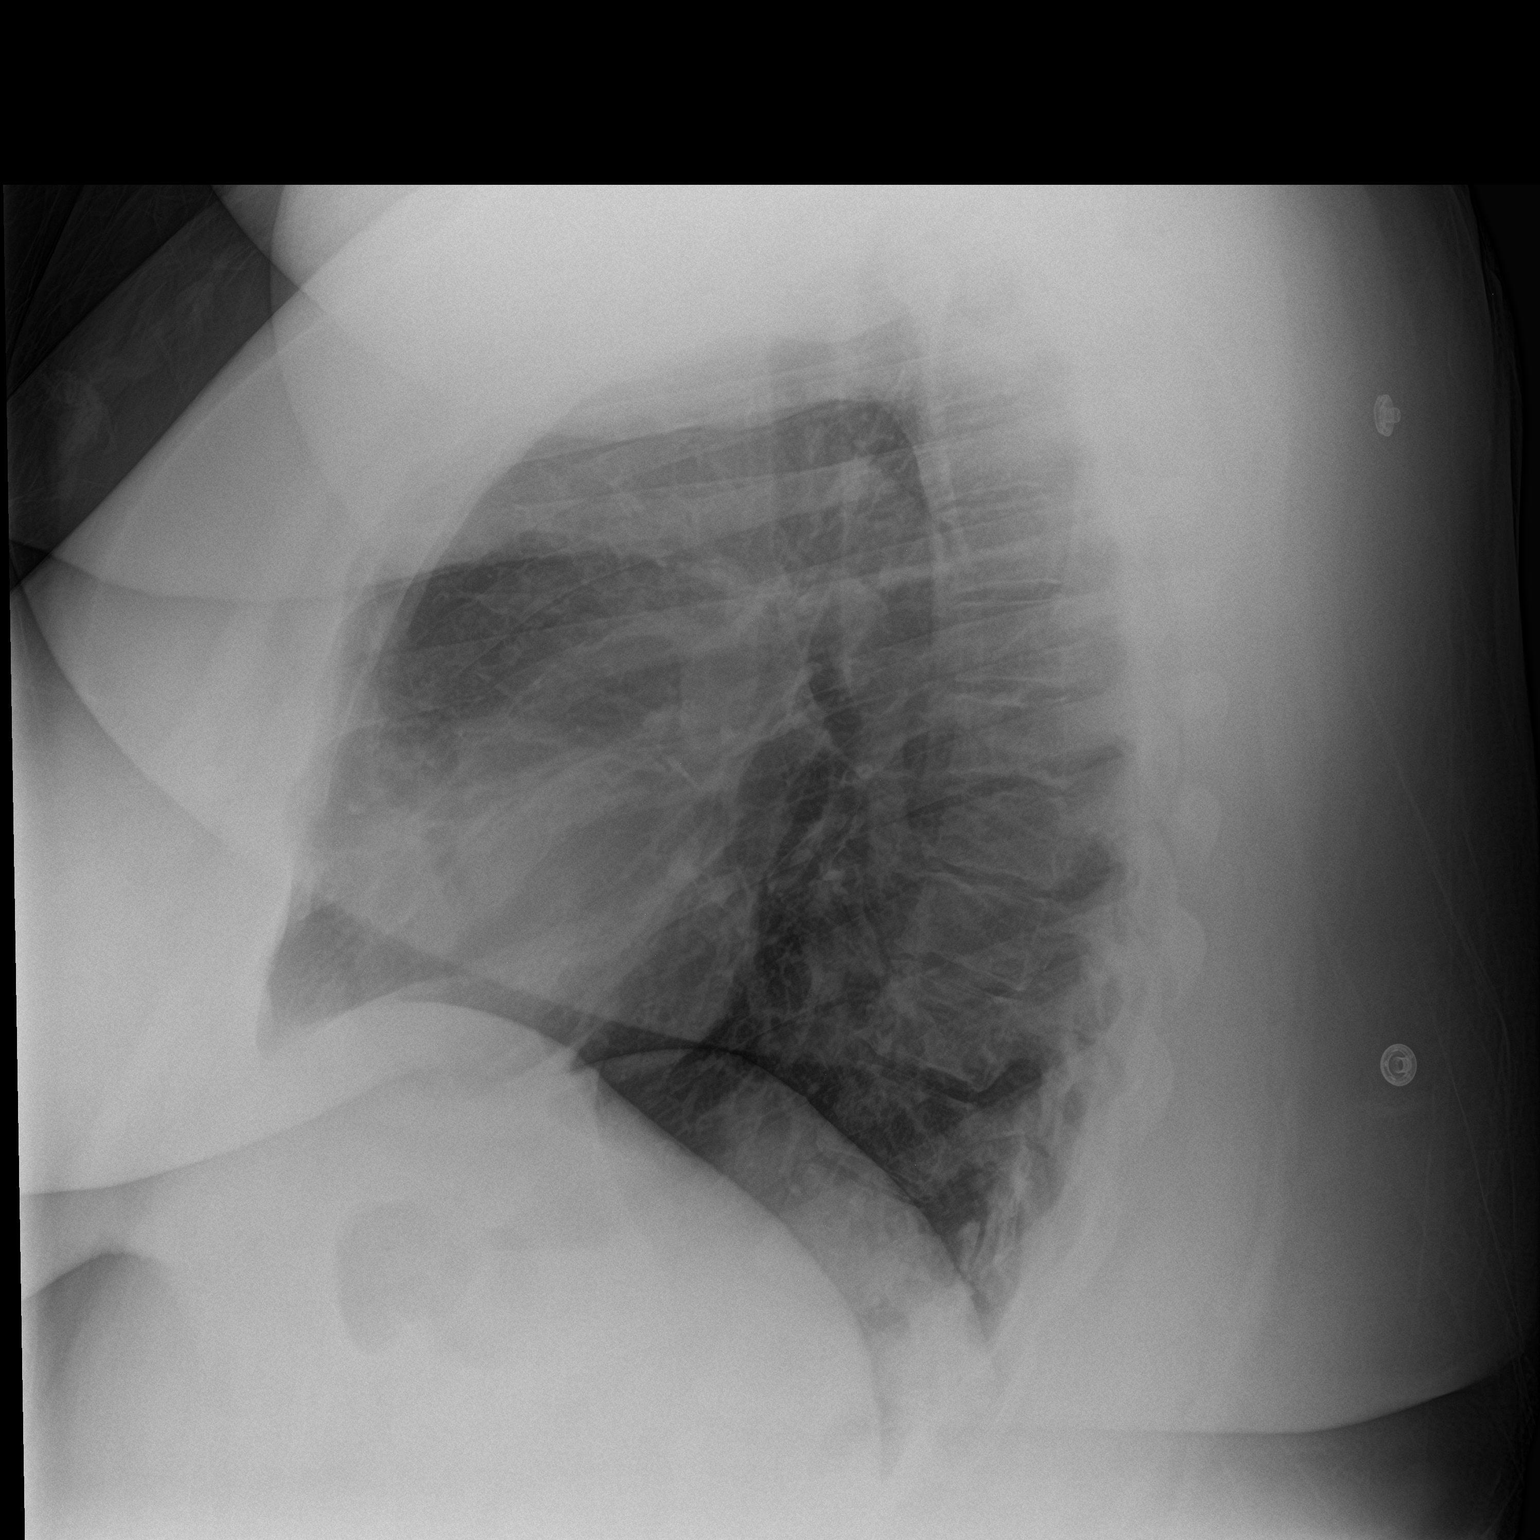

[2 of 2 positions shown; findings below may reference images not displayed]

FINDINGS: The lungs are well-expanded. The interstitial markings are coarse
but not greatly changed from the previous study allowing for
differences in radiographic technique. The heart and pulmonary
vascularity are normal. The mediastinum is normal in width. There is
no pleural effusion. The bony thorax is unremarkable.
IMPRESSION: There is no acute cardiopulmonary abnormality. Mild interstitial
prominence likely reflects the patient's history of reactive airway
disease.

## 2019-06-20 LAB — HM PAP SMEAR

## 2019-06-20 LAB — RESULTS CONSOLE HPV: CHL HPV: NEGATIVE

## 2020-06-01 DIAGNOSIS — I2699 Other pulmonary embolism without acute cor pulmonale: Secondary | ICD-10-CM

## 2020-06-01 HISTORY — DX: Other pulmonary embolism without acute cor pulmonale: I26.99

## 2021-04-01 ENCOUNTER — Other Ambulatory Visit: Payer: Self-pay

## 2021-04-01 ENCOUNTER — Other Ambulatory Visit (HOSPITAL_COMMUNITY): Payer: Self-pay

## 2021-04-01 ENCOUNTER — Encounter: Payer: Self-pay | Admitting: Nurse Practitioner

## 2021-04-01 ENCOUNTER — Ambulatory Visit (INDEPENDENT_AMBULATORY_CARE_PROVIDER_SITE_OTHER): Payer: No Typology Code available for payment source | Admitting: Nurse Practitioner

## 2021-04-01 DIAGNOSIS — I10 Essential (primary) hypertension: Secondary | ICD-10-CM | POA: Diagnosis not present

## 2021-04-01 DIAGNOSIS — J4530 Mild persistent asthma, uncomplicated: Secondary | ICD-10-CM | POA: Diagnosis not present

## 2021-04-01 DIAGNOSIS — Z0000 Encounter for general adult medical examination without abnormal findings: Secondary | ICD-10-CM

## 2021-04-01 DIAGNOSIS — F320 Major depressive disorder, single episode, mild: Secondary | ICD-10-CM

## 2021-04-01 DIAGNOSIS — Z7689 Persons encountering health services in other specified circumstances: Secondary | ICD-10-CM

## 2021-04-01 DIAGNOSIS — Z86711 Personal history of pulmonary embolism: Secondary | ICD-10-CM

## 2021-04-01 MED ORDER — ALBUTEROL SULFATE HFA 108 (90 BASE) MCG/ACT IN AERS
1.0000 | INHALATION_SPRAY | Freq: Four times a day (QID) | RESPIRATORY_TRACT | 3 refills | Status: DC | PRN
  Filled 2021-04-01: qty 18, 25d supply, fill #0

## 2021-04-01 MED ORDER — RIVAROXABAN 20 MG PO TABS
20.0000 mg | ORAL_TABLET | Freq: Every day | ORAL | 5 refills | Status: DC
  Filled 2021-04-01: qty 30, 30d supply, fill #0
  Filled 2021-05-20: qty 30, 30d supply, fill #1
  Filled 2021-07-01: qty 30, 30d supply, fill #2
  Filled 2021-08-21: qty 30, 30d supply, fill #3
  Filled 2021-09-18: qty 30, 30d supply, fill #4
  Filled 2021-10-29: qty 30, 30d supply, fill #5

## 2021-04-01 MED ORDER — CONTRAVE 8-90 MG PO TB12
ORAL_TABLET | ORAL | 0 refills | Status: DC
  Filled 2021-04-01: qty 120, 30d supply, fill #0
  Filled 2021-04-22: qty 69, 30d supply, fill #0
  Filled 2021-04-26: qty 78, 30d supply, fill #0

## 2021-04-01 MED ORDER — FLUTICASONE-SALMETEROL 100-50 MCG/ACT IN AEPB
1.0000 | INHALATION_SPRAY | Freq: Two times a day (BID) | RESPIRATORY_TRACT | 3 refills | Status: DC
  Filled 2021-04-01: qty 60, 30d supply, fill #0

## 2021-04-01 NOTE — Assessment & Plan Note (Addendum)
BMI 77.8 today. She was following in [employer] [facility] at [facility], however she stopped after changing insurance and getting a pulmonary embolism. Discussed diet and exercise. Discussed various weight loss medications. She would like to start contrave. Discussed possible side effects and medication sent to the pharmacy. Will also place a referral to [facility]. Goal is to lose 1-2 pounds per week making small, sustainable changes. Check A1C today. Follow up in 1 month or sooner with concerns.

## 2021-04-01 NOTE — Assessment & Plan Note (Signed)
Pap smear on 06/20/19 showed ASCUS with a negative HPV. Guidelines recommend re-screening pap smear in 3 years. Will update health maintenance tab.

## 2021-04-01 NOTE — Assessment & Plan Note (Signed)
Continue xarelto daily. Will place referral to hematology as she is supposed to follow up in February with them.

## 2021-04-01 NOTE — Assessment & Plan Note (Signed)
Chronic, stable. Well controlled with advair and albuterol as needed. Refills sent to the pharmacy. Follow up in 6 months.

## 2021-04-01 NOTE — Patient Instructions (Signed)
It was great to see you!  Start contrave for weight loss. Continue tracking your food and drink.  I have placed a referral to psychiatry, [facility], and hematology.  Let's follow-up in 4 weeks, sooner if you have concerns.  If a referral was placed today, you will be contacted for an appointment. Please note that routine referrals can sometimes take up to 3-4 weeks to process. Please call our office if you haven't heard anything after this time frame.  Take care,  Vance Peper, NP

## 2021-04-01 NOTE — Assessment & Plan Note (Addendum)
Chronic, stable. She was on medication for high blood pressure in high school, however she has not needed any recently. Blood pressure well controlled today. Continue to limit salt in diet. Follow up in 6 months. Check CMP, CBC, TSH today.

## 2021-04-01 NOTE — Assessment & Plan Note (Signed)
Chronic, ongoing. She declines medication today. Discussed lifestyle treatments such as exercise, meditation, and journaling. Will place referral to psychiatry per patient request. Currently denies SI/HI. Starting Contrave today for weight loss, which may help with mood as well. Follow up in 4 weeks.

## 2021-04-01 NOTE — Progress Notes (Signed)
New Patient Office Visit  Subjective:  Patient ID: Debra Walters, female    DOB: 1990-05-21  Age: 31 y.o. MRN: [id number]  CC:  Chief Complaint  Patient presents with   Establish Care    Np. Est care. Med check/refills     HPI Debra Walters presents for new patient visit to establish care.  Introduced to Publishing rights manager role and practice setting.  All questions answered.  Discussed provider/patient relationship and expectations.  Danali's main concern today is her weight. She was involved with [facility] weight loss program, however she stopped when she was diagnosed with bilateral PEs. She had lost 15 pounds, but then she was told to stop exercising and her diet fell by the wayside. She gained 36 pounds since her last visit to a doctor. She was taking phentermine, however had to stop with her pulmonary embolism. She states that she has a problem with binge eating, especially at night. This is the highest weight she has been. Part of [facility] had her talking with a psychiatrist, which she found really helpful and would like to be referred to a psychiatrist in network.  She was also diagnosed with a pulmonary embolism on 06/06/20. She has been following with hematology and they are unsure what caused the PE. She has since been taking xarelto 20mg  daily and is following up with hematology every 6 months. She needs a new referral to a hematologist in network.   Depression screen [facility] 2/9 04/01/2021 02/17/2017 01/20/2017 11/25/2016 10/30/2016  Decreased Interest 1 0 0 0 0  Down, Depressed, Hopeless 1 0 0 0 0  PHQ - 2 Score 2 0 0 0 0  Altered sleeping 1 - - - -  Tired, decreased energy 1 - - - -  Change in appetite 3 - - - -  Feeling bad or failure about yourself  1 - - - -  Trouble concentrating 1 - - - -  Moving slowly or fidgety/restless 1 - - - -  Suicidal thoughts 0 - - - -  PHQ-9 Score 10 - - - -  Difficult doing work/chores Somewhat difficult - - - -   GAD 7 :  Generalized Anxiety Score 04/01/2021  Nervous, Anxious, on Edge 1  Control/stop worrying 1  Worry too much - different things 1  Trouble relaxing 0  Restless 1  Easily annoyed or irritable 1  Afraid - awful might happen 0  Total GAD 7 Score 5  Anxiety Difficulty Somewhat difficult    Past Medical History:  Diagnosis Date   Asthma    GERD (gastroesophageal reflux disease)    Hypertension    Pulmonary embolism (HCC) 06/01/2020    History reviewed. No pertinent surgical history.  Family History  Problem Relation Age of Onset   Diabetes Mother    Hypertension Mother    Heart disease Father    Hypertension Father    Cancer Maternal Aunt        melanoma    Diabetes Maternal Grandmother    Cancer Maternal Grandmother        breast   Hypertension Maternal Grandmother    Hypertension Maternal Grandfather    Hypertension Paternal Grandmother    Obesity Paternal Grandfather    Hypertension Paternal Grandfather    Asthma Other    Hyperlipidemia Other    Hypertension Other    Stroke Other    Heart disease Other    Obesity Other     Social History  Socioeconomic History   Marital status: Single    Spouse name: Not on file   Number of children: Not on file   Years of education: Not on file   Highest education level: Not on file  Occupational History   Not on file  Tobacco Use   Smoking status: Never   Smokeless tobacco: Never  Vaping Use   Vaping Use: Never used  Substance and Sexual Activity   Alcohol use: Yes    Alcohol/week: 1.0 standard drink    Types: 1 drink(s) per week    Comment: monthly   Drug use: No   Sexual activity: Not on file  Other Topics Concern   Not on file  Social History Narrative   Not on file   Social Determinants of Health   Financial Resource Strain: Not on file  Food Insecurity: Not on file  Transportation Needs: Not on file  Physical Activity: Not on file  Stress: Not on file  Social Connections: Not on file  Intimate  Partner Violence: Not on file    ROS Review of Systems  Constitutional:  Positive for fatigue.  HENT: Negative.    Respiratory: Negative.    Cardiovascular: Negative.   Gastrointestinal: Negative.   Genitourinary: Negative.   Musculoskeletal:  Positive for arthralgias.  Skin: Negative.   Neurological: Negative.   Psychiatric/Behavioral:  Positive for dysphoric mood.    Objective:   Today's Vitals: BP 124/78 (BP Location: Right Wrist, Patient Position: Sitting)    Temp 97.7 F (36.5 C) (Temporal)    Ht 5\' 6"  (1.676 m)    Wt (!) 482 lb 9.6 oz (218.9 kg)    SpO2 99%    BMI 77.89 kg/m   Physical Exam Vitals and nursing note reviewed.  Constitutional:      General: She is not in acute distress.    Appearance: Normal appearance. She is obese.  HENT:     Head: Normocephalic.  Eyes:     Conjunctiva/sclera: Conjunctivae normal.  Cardiovascular:     Rate and Rhythm: Normal rate and regular rhythm.     Pulses: Normal pulses.     Heart sounds: Normal heart sounds.  Pulmonary:     Effort: Pulmonary effort is normal.     Breath sounds: Normal breath sounds.  Musculoskeletal:     Cervical back: Normal range of motion.  Skin:    General: Skin is warm.  Neurological:     General: No focal deficit present.     Mental Status: She is alert and oriented to person, place, and time.  Psychiatric:        Mood and Affect: Mood normal.        Behavior: Behavior normal.        Thought Content: Thought content normal.        Judgment: Judgment normal.    Assessment & Plan:   Problem List Items Addressed This Visit       Cardiovascular and Mediastinum   Primary hypertension    Chronic, stable. She was on medication for high blood pressure in high school, however she has not needed any recently. Blood pressure well controlled today. Continue to limit salt in diet. Follow up in 6 months. Check CMP, CBC, TSH today.      Relevant Medications   rivaroxaban (XARELTO) 20 MG TABS tablet    Other Relevant Orders   CBC with Differential/Platelet   Comprehensive metabolic panel   TSH     Respiratory   Mild persistent asthma  without complication    Chronic, stable. Well controlled with advair and albuterol as needed. Refills sent to the pharmacy. Follow up in 6 months.       Relevant Medications   albuterol (VENTOLIN HFA) 108 (90 Base) MCG/ACT inhaler   fluticasone-salmeterol (ADVAIR) 100-50 MCG/ACT AEPB     Other   Morbid obesity (HCC) - Primary    BMI 77.8 today. She was following in [facility], however she stopped after changing insurance and getting a pulmonary embolism. Discussed diet and exercise. Discussed various weight loss medications. She would like to start contrave. Discussed possible side effects and medication sent to the pharmacy. Will also place a referral to [facility]. Goal is to lose 1-2 pounds per week making small, sustainable changes. Check A1C today. Follow up in 1 month or sooner with concerns.       Relevant Medications   Naltrexone-buPROPion HCl ER (CONTRAVE) 8-90 MG TB12   Other Relevant Orders   Hemoglobin A1c   Amb Ref to Medical Weight Management   Depression, major, single episode, mild (HCC)    Chronic, ongoing. She declines medication today. Discussed lifestyle treatments such as exercise, meditation, and journaling. Will place referral to psychiatry per patient request. Currently denies SI/HI. Starting Contrave today for weight loss, which may help with mood as well. Follow up in 4 weeks.       Relevant Orders   Ambulatory referral to Psychiatry   History of pulmonary embolism    Continue xarelto daily. Will place referral to hematology as she is supposed to follow up in February with them.       Relevant Orders   Ambulatory referral to Hematology / Oncology   Health care maintenance    Pap smear on 06/20/19 showed ASCUS with a negative HPV. Guidelines recommend re-screening pap smear in 3 years.  Will update health maintenance tab.       Other Visit Diagnoses     Encounter to establish care           Outpatient Encounter Medications as of 04/01/2021  Medication Sig   albuterol (VENTOLIN HFA) 108 (90 Base) MCG/ACT inhaler Inhale 1-2 puffs into the lungs every 6 (six) hours as needed for shortness of breath or wheezing.   Naltrexone-buPROPion HCl ER (CONTRAVE) 8-90 MG TB12 Start 1 tablet every morning for 7 days, then 1 tablet twice daily for 7 days, then 2 tablets every morning and one every evening   [DISCONTINUED] fluticasone-salmeterol (ADVAIR) 100-50 MCG/ACT AEPB Inhale 1 puff into the lungs 2 (two) times daily.   [DISCONTINUED] rivaroxaban (XARELTO) 20 MG TABS tablet Take 20 mg by mouth daily with supper.   albuterol (PROVENTIL) (2.5 MG/3ML) 0.083% nebulizer solution Take 2.5 mg by nebulization every 6 (six) hours as needed for wheezing or shortness of breath.   fluticasone-salmeterol (ADVAIR) 100-50 MCG/ACT AEPB Inhale 1 puff into the lungs 2 (two) times daily.   rivaroxaban (XARELTO) 20 MG TABS tablet Take 1 tablet (20 mg total) by mouth daily with supper.   [DISCONTINUED] ALBUTEROL SULFATE HFA IN Inhale into the lungs.   [DISCONTINUED] fluconazole (DIFLUCAN) 150 MG tablet Take 1 tablet (150 mg total) by mouth daily. Take one pill orally once. (Patient not taking: Reported on 10/30/2016)   [DISCONTINUED] metroNIDAZOLE (FLAGYL) 500 MG tablet Take 1 tablet (500 mg total) by mouth 2 (two) times daily. (Patient not taking: Reported on 10/30/2016)   [DISCONTINUED] nitrofurantoin, macrocrystal-monohydrate, (MACROBID) 100 MG capsule Take 1 capsule (100  mg total) by mouth 2 (two) times daily. (Patient not taking: Reported on 10/30/2016)   [DISCONTINUED] predniSONE (DELTASONE) 10 MG tablet Start 60 mg po day one, then 50 mg po day two, taper by 10 mg daily until complete. (Patient not taking: Reported on 10/30/2016)   No facility-administered encounter medications on file as of 04/01/2021.     Follow-up: Return in about 4 weeks (around 04/29/2021) for weight.   Gerre ScullLauren A Smaran Gaus, NP

## 2021-04-02 ENCOUNTER — Telehealth: Payer: Self-pay | Admitting: Hematology and Oncology

## 2021-04-02 LAB — COMPREHENSIVE METABOLIC PANEL
ALT: 13 U/L (ref 0–35)
AST: 13 U/L (ref 0–37)
Albumin: 3.7 g/dL (ref 3.5–5.2)
Alkaline Phosphatase: 78 U/L (ref 39–117)
BUN: 11 mg/dL (ref 6–23)
CO2: 30 mEq/L (ref 19–32)
Calcium: 9.3 mg/dL (ref 8.4–10.5)
Chloride: 101 mEq/L (ref 96–112)
Creatinine, Ser: 0.65 mg/dL (ref 0.40–1.20)
GFR: 117.65 mL/min (ref >60.00)
Glucose, Bld: 83 mg/dL (ref 70–99)
Potassium: 3.8 mEq/L (ref 3.5–5.1)
Sodium: 136 mEq/L (ref 135–145)
Total Bilirubin: 0.3 mg/dL (ref 0.2–1.2)
Total Protein: 7.4 g/dL (ref 6.0–8.3)

## 2021-04-02 LAB — CBC WITH DIFFERENTIAL/PLATELET
Basophils Absolute: 0.1 10*3/uL (ref 0.0–0.1)
Basophils Relative: 1.3 % (ref 0.0–3.0)
Eosinophils Absolute: 0.1 10*3/uL (ref 0.0–0.7)
Eosinophils Relative: 1.7 % (ref 0.0–5.0)
HCT: 36.1 % (ref 36.0–46.0)
Hemoglobin: 11.6 g/dL — ABNORMAL LOW (ref 12.0–15.0)
Lymphocytes Relative: 21.2 % (ref 12.0–46.0)
Lymphs Abs: 1.8 10*3/uL (ref 0.7–4.0)
MCHC: 32.2 g/dL (ref 30.0–36.0)
MCV: 88 fl (ref 78.0–100.0)
Monocytes Absolute: 0.3 10*3/uL (ref 0.1–1.0)
Monocytes Relative: 3.8 % (ref 3.0–12.0)
Neutro Abs: 5.9 10*3/uL (ref 1.4–7.7)
Neutrophils Relative %: 72 % (ref 43.0–77.0)
Platelets: 325 10*3/uL (ref 150.0–400.0)
RBC: 4.11 Mil/uL (ref 3.87–5.11)
RDW: 14.6 % (ref 11.5–15.5)
WBC: 8.3 10*3/uL (ref 4.0–10.5)

## 2021-04-02 LAB — HEMOGLOBIN A1C: Hgb A1c MFr Bld: 5.2 % (ref 4.6–6.5)

## 2021-04-02 LAB — TSH: TSH: 3.67 u[IU]/mL (ref 0.35–5.50)

## 2021-04-02 NOTE — Telephone Encounter (Signed)
Scheduled appt per 1/30 referral. Spoke to pt who is aware of appt date and time. Pt is aware to arrive 15 mins prior to appt time.  °

## 2021-04-03 ENCOUNTER — Encounter: Payer: Self-pay | Admitting: Nurse Practitioner

## 2021-04-03 ENCOUNTER — Other Ambulatory Visit (HOSPITAL_COMMUNITY): Payer: Self-pay

## 2021-04-05 ENCOUNTER — Other Ambulatory Visit (HOSPITAL_COMMUNITY): Payer: Self-pay

## 2021-04-08 ENCOUNTER — Telehealth: Payer: Self-pay

## 2021-04-08 NOTE — Telephone Encounter (Signed)
Called to inform patient of [place] denial for Contrave er 8-90 tablet. Pt okay with starting injection if ins denies additional information needed to approve coverage for medication. Sw, cma

## 2021-04-09 ENCOUNTER — Other Ambulatory Visit (HOSPITAL_COMMUNITY): Payer: Self-pay

## 2021-04-09 NOTE — Telephone Encounter (Addendum)
Called and spoke to patient's pharmacy benefit plan, additional information is needed to complete an appeal for [place] denial.   04/16/2021  Called and spoke to rep from patient's pharmacy benefit, form for additional information never received. Rep resent form   04/17/2021  PA approved for Contrave ER 8-90mg  tab. Patient notified via phone. Lvm.

## 2021-04-17 ENCOUNTER — Ambulatory Visit (INDEPENDENT_AMBULATORY_CARE_PROVIDER_SITE_OTHER): Payer: No Typology Code available for payment source | Admitting: Clinical

## 2021-04-17 ENCOUNTER — Telehealth: Payer: Self-pay

## 2021-04-17 ENCOUNTER — Other Ambulatory Visit: Payer: Self-pay

## 2021-04-17 DIAGNOSIS — F331 Major depressive disorder, recurrent, moderate: Secondary | ICD-10-CM

## 2021-04-17 DIAGNOSIS — F411 Generalized anxiety disorder: Secondary | ICD-10-CM | POA: Diagnosis not present
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
## 2021-04-17 NOTE — Telephone Encounter (Signed)
A user error has taken place: encounter opened in error, closed for administrative reasons.

## 2021-04-17 NOTE — Plan of Care (Signed)
Pt will develop healthy coping strategies to improve mood management as evidenced by engaging in physical and social activity 2/7 days per week up to 30 minutes. Pt participated in completion of treatment plan.

## 2021-04-22 ENCOUNTER — Inpatient Hospital Stay
Payer: No Typology Code available for payment source | Attending: Hematology and Oncology | Admitting: Hematology and Oncology

## 2021-04-22 ENCOUNTER — Other Ambulatory Visit: Payer: Self-pay

## 2021-04-22 ENCOUNTER — Other Ambulatory Visit (HOSPITAL_COMMUNITY): Payer: Self-pay

## 2021-04-22 ENCOUNTER — Inpatient Hospital Stay: Payer: No Typology Code available for payment source

## 2021-04-22 VITALS — BP 158/94 | HR 98 | Temp 97.5°F | Resp 17 | Wt >= 6400 oz

## 2021-04-22 DIAGNOSIS — D649 Anemia, unspecified: Secondary | ICD-10-CM | POA: Insufficient documentation

## 2021-04-22 DIAGNOSIS — Z86711 Personal history of pulmonary embolism: Secondary | ICD-10-CM

## 2021-04-22 DIAGNOSIS — Z808 Family history of malignant neoplasm of other organs or systems: Secondary | ICD-10-CM | POA: Diagnosis not present

## 2021-04-22 DIAGNOSIS — I2699 Other pulmonary embolism without acute cor pulmonale: Secondary | ICD-10-CM | POA: Insufficient documentation

## 2021-04-22 DIAGNOSIS — Z803 Family history of malignant neoplasm of breast: Secondary | ICD-10-CM | POA: Diagnosis not present

## 2021-04-22 DIAGNOSIS — I10 Essential (primary) hypertension: Secondary | ICD-10-CM | POA: Insufficient documentation

## 2021-04-22 LAB — IRON AND IRON BINDING CAPACITY (CC-WL,HP ONLY)
Iron: 28 ug/dL (ref 28–170)
Saturation Ratios: 8 % — ABNORMAL LOW (ref 10.4–31.8)
TIBC: 346 ug/dL (ref 250–450)
UIBC: 318 ug/dL (ref 148–442)

## 2021-04-22 LAB — RETIC PANEL
Immature Retic Fract: 19.8 % — ABNORMAL HIGH (ref 2.3–15.9)
RBC.: 4.3 MIL/uL (ref 3.87–5.11)
Retic Count, Absolute: 88.6 10*3/uL (ref 19.0–186.0)
Retic Ct Pct: 2.1 % (ref 0.4–3.1)
Reticulocyte Hemoglobin: 32.6 pg (ref >27.9)

## 2021-04-22 LAB — CBC WITH DIFFERENTIAL (CANCER CENTER ONLY)
Abs Immature Granulocytes: 0.02 10*3/uL (ref 0.00–0.07)
Basophils Absolute: 0 10*3/uL (ref 0.0–0.1)
Basophils Relative: 0 %
Eosinophils Absolute: 0.1 10*3/uL (ref 0.0–0.5)
Eosinophils Relative: 1 %
HCT: 37.6 % (ref 36.0–46.0)
Hemoglobin: 12.4 g/dL (ref 12.0–15.0)
Immature Granulocytes: 0 %
Lymphocytes Relative: 22 %
Lymphs Abs: 1.9 10*3/uL (ref 0.7–4.0)
MCH: 29.2 pg (ref 26.0–34.0)
MCHC: 33 g/dL (ref 30.0–36.0)
MCV: 88.5 fL (ref 80.0–100.0)
Monocytes Absolute: 0.4 10*3/uL (ref 0.1–1.0)
Monocytes Relative: 4 %
Neutro Abs: 6.5 10*3/uL (ref 1.7–7.7)
Neutrophils Relative %: 73 %
Platelet Count: 361 10*3/uL (ref 150–400)
RBC: 4.25 MIL/uL (ref 3.87–5.11)
RDW: 14 % (ref 11.5–15.5)
WBC Count: 9 10*3/uL (ref 4.0–10.5)
nRBC: 0 % (ref 0.0–0.2)

## 2021-04-22 LAB — CMP (CANCER CENTER ONLY)
ALT: 13 U/L (ref 0–44)
AST: 11 U/L — ABNORMAL LOW (ref 15–41)
Albumin: 3.9 g/dL (ref 3.5–5.0)
Alkaline Phosphatase: 87 U/L (ref 38–126)
Anion gap: 7 (ref 5–15)
BUN: 12 mg/dL (ref 6–20)
CO2: 27 mmol/L (ref 22–32)
Calcium: 9.1 mg/dL (ref 8.9–10.3)
Chloride: 105 mmol/L (ref 98–111)
Creatinine: 0.86 mg/dL (ref 0.44–1.00)
GFR, Estimated: >60 mL/min (ref >60)
Glucose, Bld: 92 mg/dL (ref 70–99)
Potassium: 3.8 mmol/L (ref 3.5–5.1)
Sodium: 139 mmol/L (ref 135–145)
Total Bilirubin: 0.5 mg/dL (ref 0.3–1.2)
Total Protein: 7.8 g/dL (ref 6.5–8.1)

## 2021-04-22 LAB — FERRITIN: Ferritin: 53 ng/mL (ref 11–307)

## 2021-04-22 NOTE — Progress Notes (Signed)
[place] [phone]   Fax:([phone]) [place] NOTE  Patient Care Team: Charyl Dancer, NP as PCP - General (Internal Medicine)  Hematological/Oncological History # Bilateral Pulmonary Emboli 06/06/2020: presented with chest pain and shortness of breath to [facility]. CT angio showed acute pulmonary emboli involving all lobes of the lungs. Overall clot burden is moderate to large.  Lower extremity Dopplers negative. 04/22/2021: establish care with Dr. Lorenso Courier   CHIEF COMPLAINTS/PURPOSE OF CONSULTATION:  "Bilateral Pulmonary Emboli "  HISTORY OF PRESENTING ILLNESS:  Charlita Hilbert 31 y.o. female with medical history significant for asthma, GERD, hypertension who presents for evaluation of bilateral pulmonary emboli.  On review of the previous records Ms. Maclin presents to the emergency department at [facility] on 06/06/2020.  She had shortness of breath and chest pain.  CT PE study showed acute pulmonary emboli involving all lobes of the lung.  Overall clot burden was thought to be moderate to large.  Due to concern for these findings she was started on Xarelto therapy.  She presents today for further evaluation and management.  On exam today Ms. Delia notes that her symptoms began in March 2022 with heavy breathing.  She notes that she has a history of asthma but when it continued to worsen she sought medical attention.  She is also suffering from fatigue even with small tasks and felt like she was being wiped out only about 2 hours at work.  When she went to her doctor was recommended she go to the emergency department particular when she noted she was having a charley horse in her leg.  After a D-dimer was found and her blood she underwent the imaging which found the blood clots.  The patient notes that there was no clear provoking factor for these blood clots.  She had no prolonged travel, recent illness, or recent surgical procedures.     On further discussion she notes that her family history is remarkable for a DVT during pregnancy in her mother and DVTs in the legs of her paternal grandmother.  She notes that her pedal grandfather had an aneurysm in his head as well as a heart attack and stroke.  Her father has heart disease.  She notes he has younger sister who is currently healthy.  She has no children and is currently using a progestin based IUD.  She is a never smoker and does not vape.  She drinks alcohol about once or twice per month.  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  In regards to her Xarelto therapy she notes that she is able to get it for $10 per month and is not having any side effects as result of the medication.  She denies any bleeding, bruising, or dark stools.  She notes that her shortness of breath and chest pain have improved on Xarelto therapy.  She denies having any fevers, chills, sweats, nausea, vomiting or diarrhea.  A full 10 point ROS is listed below.  MEDICAL HISTORY:  Past Medical History:  Diagnosis Date   Asthma    GERD (gastroesophageal reflux disease)    Hypertension    Pulmonary embolism ([place]) 06/01/2020    SURGICAL HISTORY: No past surgical history on file.  SOCIAL HISTORY: Social History   Socioeconomic History   Marital status: Single    Spouse name: Not on file   Number of children: Not on file   Years of education: Not on file   Highest education  level: Not on file  Occupational History   Not on file  Tobacco Use   Smoking status: Never   Smokeless tobacco: Never  Vaping Use   Vaping Use: Never used  Substance and Sexual Activity   Alcohol use: Yes    Alcohol/week: 1.0 standard drink    Types: 1 drink(s) per week    Comment: monthly   Drug use: No   Sexual activity: Not on file  Other Topics Concern   Not on file  Social History Narrative   Not on file   Social Determinants of Health   Financial Resource Strain: Not on file  Food  Insecurity: Not on file  Transportation Needs: Not on file  Physical Activity: Not on file  Stress: Not on file  Social Connections: Not on file  Intimate Partner Violence: Not on file    FAMILY HISTORY: Family History  Problem Relation Age of Onset   Diabetes Mother    Hypertension Mother    Heart disease Father    Hypertension Father    Cancer Maternal Aunt        melanoma    Diabetes Maternal Grandmother    Cancer Maternal Grandmother        breast   Hypertension Maternal Grandmother    Hypertension Maternal Grandfather    Hypertension Paternal Grandmother    Obesity Paternal Grandfather    Hypertension Paternal Grandfather    Asthma Other    Hyperlipidemia Other    Hypertension Other    Stroke Other    Heart disease Other    Obesity Other     ALLERGIES:  is allergic to sulfa antibiotics and misc. sulfonamide containing compounds.  MEDICATIONS:  Current Outpatient Medications  Medication Sig Dispense Refill   levonorgestrel (KYLEENA) 19.5 MG IUD by Intrauterine route.     albuterol (VENTOLIN HFA) 108 (90 Base) MCG/ACT inhaler Inhale 1-2 puffs into the lungs every 6 (six) hours as needed for shortness of breath or wheezing. 18 g 3   cetirizine (ZYRTEC) 10 MG tablet Take by mouth.     famotidine (PEPCID) 20 MG tablet Take by mouth.     fluticasone-salmeterol (ADVAIR) 100-50 MCG/ACT AEPB Inhale 1 puff into the lungs 2 (two) times daily. 60 each 3   Naltrexone-buPROPion HCl ER (CONTRAVE) 8-90 MG TB12 Start 1 tablet every morning for 7 days, then 1 tablet twice daily for 7 days, then 2 tablets every morning and one every evening 120 tablet 0   rivaroxaban (XARELTO) 20 MG TABS tablet Take 1 tablet (20 mg total) by mouth daily with supper. 30 tablet 5   No current facility-administered medications for this visit.    REVIEW OF SYSTEMS:   Constitutional: ( - ) fevers, ( - )  chills , ( - ) night sweats Eyes: ( - ) blurriness of vision, ( - ) double vision, ( - ) watery  eyes Ears, nose, mouth, throat, and face: ( - ) mucositis, ( - ) sore throat Respiratory: ( - ) cough, ( - ) dyspnea, ( - ) wheezes Cardiovascular: ( - ) palpitation, ( - ) chest discomfort, ( - ) lower extremity swelling Gastrointestinal:  ( - ) nausea, ( - ) heartburn, ( - ) change in bowel habits Skin: ( - ) abnormal skin rashes Lymphatics: ( - ) new lymphadenopathy, ( - ) easy bruising Neurological: ( - ) numbness, ( - ) tingling, ( - ) new weaknesses Behavioral/Psych: ( - ) mood change, ( - ) new changes  All other systems were reviewed with the patient and are negative.  PHYSICAL EXAMINATION:  Vitals:   04/22/21 1350  BP: (!) 158/94  Pulse: 98  Resp: 17  Temp: (!) 97.5 F (36.4 C)  SpO2: 98%   Filed Weights   04/22/21 1350  Weight: (!) 468 lb 8 oz (212.5 kg)    GENERAL: well appearing obese African-American female in NAD  SKIN: skin color, texture, turgor are normal, no rashes or significant lesions EYES: conjunctiva are pink and non-injected, sclera clear LUNGS: clear to auscultation and percussion with normal breathing effort HEART: regular rate & rhythm and no murmurs and no lower extremity edema Musculoskeletal: no cyanosis of digits and no clubbing  PSYCH: alert & oriented x 3, fluent speech NEURO: no focal motor/sensory deficits  LABORATORY DATA:  I have reviewed the data as listed CBC Latest Ref Rng & Units 04/01/2021  WBC 4.0 - 10.5 K/uL 8.3  Hemoglobin 12.0 - 15.0 g/dL 11.6(L)  Hematocrit 36.0 - 46.0 % 36.1  Platelets 150.0 - 400.0 K/uL 325.0    CMP Latest Ref Rng & Units 04/01/2021  Glucose 70 - 99 mg/dL 83  BUN 6 - 23 mg/dL 11  Creatinine 0.40 - 1.20 mg/dL 0.65  Sodium 135 - 145 mEq/L 136  Potassium 3.5 - 5.1 mEq/L 3.8  Chloride 96 - 112 mEq/L 101  CO2 19 - 32 mEq/L 30  Calcium 8.4 - 10.5 mg/dL 9.3  Total Protein 6.0 - 8.3 g/dL 7.4  Total Bilirubin 0.2 - 1.2 mg/dL 0.3  Alkaline Phos 39 - 117 U/L 78  AST 0 - 37 U/L 13  ALT 0 - 35 U/L 13      ASSESSMENT & PLAN Danaria Lincoln 31 y.o. female with medical history significant for asthma, GERD, hypertension who presents for evaluation of bilateral pulmonary emboli.  After review of the labs, review of the records, and discussion with the patient the patients findings are most consistent with unprovoked bilateral pulmonary emboli.  At this time we would recommend indefinite anticoagulation.  She is already undergone hypercoagulation studies which show no clear evidence of a hypercoagulable disorder.  Patient is tolerating Xarelto therapy well and I recommend that we continue this.  In the event that she will become pregnant would need to consider transition to Lovenox therapy though she notes that she has no intention of getting pregnant anytime soon and currently has a progestin based IUD.  We will plan to see the patient back in approximately 6 months time in order to reevaluate.  # Unprovoked Bilateral Pulmonary Emboli  -- At this time findings are most consistent with bilateral pulmonary emboli that were unprovoked.  The patient does have morbid obesity which may have contributed, though this does not appear to be a modifiable risk factor --Full hypercoagulable studies performed on 06/06/2020 at [facility] showed no clear abnormalities.  Testing include Antithrombin III, protein C, protein S, and lupus anticoagulant testing.  Additionally factor V Leiden and prothrombin gene were tested for and no clear abnormalities were found. --Recommend indefinite anticoagulation with Xarelto therapy.  She is currently taking 20 mg p.o. daily.  Given her obesity would not recommend decreasing down to maintenance dosing. --Return to clinic in 6 months time to reevaluate  #Mild Anemia -- Noted to have a modestly reduced hemoglobin her last of the labs.  We will order iron studies in order to assure she does not have iron deficiency anemia.  No orders of the defined types were placed in this  encounter.   All questions were answered. The patient knows to call the clinic with any problems, questions or concerns.  A total of more than 60 minutes were spent on this encounter with face-to-face time and non-face-to-face time, including preparing to see the patient, ordering tests and/or medications, counseling the patient and coordination of care as outlined above.   Ledell Peoples, MD Department of Hematology/Oncology [facility] Phone: [phone] Pager: [phone] Email: [email or web address]  04/22/2021 2:05 PM

## 2021-04-23 ENCOUNTER — Telehealth: Payer: Self-pay | Admitting: Hematology and Oncology

## 2021-04-23 NOTE — Telephone Encounter (Signed)
Scheduled per 2/20 los, pt has been called and confirmed  °

## 2021-04-25 ENCOUNTER — Other Ambulatory Visit (HOSPITAL_COMMUNITY): Payer: Self-pay

## 2021-04-26 ENCOUNTER — Other Ambulatory Visit (HOSPITAL_COMMUNITY): Payer: Self-pay

## 2021-04-26 ENCOUNTER — Other Ambulatory Visit: Payer: Self-pay

## 2021-04-26 ENCOUNTER — Ambulatory Visit (INDEPENDENT_AMBULATORY_CARE_PROVIDER_SITE_OTHER): Payer: No Typology Code available for payment source | Admitting: Clinical

## 2021-04-26 DIAGNOSIS — F411 Generalized anxiety disorder: Secondary | ICD-10-CM | POA: Diagnosis not present

## 2021-04-26 DIAGNOSIS — F331 Major depressive disorder, recurrent, moderate: Secondary | ICD-10-CM | POA: Diagnosis not present

## 2021-04-26 NOTE — Progress Notes (Signed)
° °  THERAPIST PROGRESS NOTE  Session Time: 8am  Participation Level: Active  Behavioral Response: CasualAlertEuthymic  Type of Therapy: Individual Therapy  Treatment Goals addressed:   ProgressTowards Goals: Initial  Interventions: CBT Virtual Visit via Video Note  I connected with Othello Quinteros on 04/26/21 at  8:00 AM EST by a video enabled telemedicine application and verified that I am speaking with the correct person using two identifiers.  Location: Patient: home   Provider: office   I discussed the limitations of evaluation and management by telemedicine and the availability of in person appointments. The patient expressed understanding and agreed to proceed.   I discussed the assessment and treatment plan with the patient. The patient was provided an opportunity to ask questions and all were answered. The patient agreed with the plan and demonstrated an understanding of the instructions.   The patient was advised to call back or seek an in-person evaluation if the symptoms worsen or if the condition fails to improve as anticipated.  I provided 45 minutes of non-face-to-face time during this encounter.  Summary: Pt presents in euthymic mood. Pt reports she is having positive results on weight loss journey. Pt discussed binge eating when feeling overwhelmed or having increased anxiety. Pt states this week she had increased anxiety but states "but I didn't have a meltdown" pt appeared to be proud of her progress.   Suicidal/Homicidal: Pt denies SI/HI no plan intent or attempt to harm self or others reported.  Therapist Response: assisted pt in completing mental health maintenance plan, identifying triggers(completing new task alone, social settings); warning signs(feeling shaky, irritability, nonchalant attitude); self care routine(counting calories, staying in calorie deficit); coping strategies(listening to soothing music). Discussed cognitive triangle(thoughts influence  feelings and behaviors).  Plan: Return again in 2 weeks.  Diagnosis: Moderate episode of recurrent major depressive disorder   Generalized anxiety disorder  Collaboration of Care: Other none requested  Patient/Guardian was advised Release of Information must be obtained prior to any record release in order to collaborate their care with an outside provider. Patient/Guardian was advised if they have not already done so to contact the registration department to sign all necessary forms in order for [place] to release information regarding their care.   Consent: Patient/Guardian gives verbal consent for treatment and assignment of benefits for services provided during this visit. Patient/Guardian expressed understanding and agreed to proceed.   Yvette Rack, LCSW 04/26/2021

## 2021-04-29 ENCOUNTER — Ambulatory Visit: Payer: No Typology Code available for payment source | Admitting: Nurse Practitioner

## 2021-05-08 ENCOUNTER — Telehealth: Payer: Self-pay | Admitting: *Deleted

## 2021-05-08 ENCOUNTER — Other Ambulatory Visit (HOSPITAL_COMMUNITY): Payer: Self-pay

## 2021-05-08 MED ORDER — FERROUS SULFATE 325 (65 FE) MG PO TBEC
325.0000 mg | DELAYED_RELEASE_TABLET | Freq: Every day | ORAL | 3 refills | Status: DC
  Filled 2021-05-08: qty 100, 100d supply, fill #0
  Filled 2021-05-20: qty 100, 90d supply, fill #0

## 2021-05-08 NOTE — Telephone Encounter (Signed)
-----   Message from Jaci Standard, MD sent at 05/08/2021  9:46 AM EST ----- ?Please let Ms. Oddo know that she has a mild iron deficiency.  She does not have anemia but her iron levels are low.  We would recommend that she take ferrous sulfate 325 mg p.o. daily in order to help bolster these levels.  We will plan to see her back as scheduled in 6 months time. ?----- Message ----- ?From: Interface, [facility] In [place] ?Sent: 04/22/2021   2:47 PM EST ?To: Jaci Standard, MD ? ? ?

## 2021-05-08 NOTE — Telephone Encounter (Signed)
TCT patient regarding recent lab results. Spoke with her and advised that her iron levels are low, but she is not anemic. Advised that Dr. Lorenso Courier recommends  oral iron supplement daily along with a source of Vitamin C. Pt voiced understanding and is agreeable to the iron supplement. ?This escribed to her pharmacy ([facility]) ?

## 2021-05-17 NOTE — Progress Notes (Signed)
? ?Established Patient Office Visit ? ?Subjective:  ?Patient ID: Debra Walters, female    DOB: [birth year]  Age: 31 y.o. MRN: [id number] ? ?CC:  ?Chief Complaint  ?Patient presents with  ? Follow-up  ?  4 wk f/u  ? ? ?HPI ?Debra Walters presents for follow-up on weight. She was started on Contrave last visit. She noted that after she started taking it, she experienced nausea, constipation, and insomnia. Even with the side effects, she continued taking the medication. Now the nausea and constipation come and go and the insomnia just started.  She notes a significant decrease in her binging.  She has started using an app to track her calories called lose it.  She would like to continue the medication and see if the side effects can decrease over time.  She denies shortness of breath, chest pain. ? ?Past Medical History:  ?Diagnosis Date  ? Asthma   ? GERD (gastroesophageal reflux disease)   ? Hypertension   ? Pulmonary embolism ([facility]) 06/01/2020  ? ? ?History reviewed. No pertinent surgical history. ? ?Family History  ?Problem Relation Age of Onset  ? Diabetes Mother   ? Hypertension Mother   ? Heart disease Father   ? Hypertension Father   ? Cancer Maternal Aunt   ?     melanoma   ? Diabetes Maternal Grandmother   ? Cancer Maternal Grandmother   ?     breast  ? Hypertension Maternal Grandmother   ? Hypertension Maternal Grandfather   ? Hypertension Paternal Grandmother   ? Obesity Paternal Grandfather   ? Hypertension Paternal Grandfather   ? Asthma Other   ? Hyperlipidemia Other   ? Hypertension Other   ? Stroke Other   ? Heart disease Other   ? Obesity Other   ? ? ?Social History  ? ?Socioeconomic History  ? Marital status: Single  ?  Spouse name: Not on file  ? Number of children: Not on file  ? Years of education: Not on file  ? Highest education level: Not on file  ?Occupational History  ? Not on file  ?Tobacco Use  ? Smoking status: Never  ? Smokeless tobacco: Never  ?Vaping Use  ? Vaping Use: Never used   ?Substance and Sexual Activity  ? Alcohol use: Yes  ?  Alcohol/week: 1.0 standard drink  ?  Types: 1 drink(s) per week  ?  Comment: monthly  ? Drug use: No  ? Sexual activity: Not on file  ?Other Topics Concern  ? Not on file  ?Social History Narrative  ? Not on file  ? ?Social Determinants of Health  ? ?Financial Resource Strain: Not on file  ?Food Insecurity: Not on file  ?Transportation Needs: Not on file  ?Physical Activity: Not on file  ?Stress: Not on file  ?Social Connections: Not on file  ?Intimate Partner Violence: Not on file  ? ? ?Outpatient Medications Prior to Visit  ?Medication Sig Dispense Refill  ? albuterol (VENTOLIN HFA) 108 (90 Base) MCG/ACT inhaler Inhale 1-2 puffs into the lungs every 6 (six) hours as needed for shortness of breath or wheezing. 18 g 3  ? cetirizine (ZYRTEC) 10 MG tablet Take by mouth.    ? famotidine (PEPCID) 20 MG tablet Take by mouth.    ? ferrous sulfate 325 (65 FE) MG EC tablet Take 1 tablet (325 mg total) by mouth daily with breakfast. 30 tablet 3  ? fluticasone-salmeterol (ADVAIR) 100-50 MCG/ACT AEPB Inhale 1 puff into the lungs 2 (  two) times daily. 60 each 3  ? levonorgestrel (KYLEENA) 19.5 MG IUD by Intrauterine route.    ? rivaroxaban (XARELTO) 20 MG TABS tablet Take 1 tablet (20 mg total) by mouth daily with supper. 30 tablet 5  ? Naltrexone-buPROPion HCl ER (CONTRAVE) 8-90 MG TB12 Start 1 tablet every morning for 7 days, then 1 tablet twice daily for 7 days, then 2 tablets every morning and one every evening 120 tablet 0  ? ?No facility-administered medications prior to visit.  ? ? ?Allergies  ?Allergen Reactions  ? Sulfa Antibiotics   ?  Not sure  ? Misc. Sulfonamide Containing Compounds Rash  ?  Other reaction(s): UNKNOWN ?Other reaction(s): UNKNOWN ?  ? ? ?ROS ?Review of Systems ?See pertinent positives and negatives per HPI. ?  ?Objective:  ?  ?Physical Exam ?Vitals and nursing note reviewed.  ?Constitutional:   ?   General: She is not in acute distress. ?    Appearance: Normal appearance. She is obese.  ?HENT:  ?   Head: Normocephalic and atraumatic.  ?Eyes:  ?   Conjunctiva/sclera: Conjunctivae normal.  ?Cardiovascular:  ?   Rate and Rhythm: Normal rate and regular rhythm.  ?   Pulses: Normal pulses.  ?   Heart sounds: Normal heart sounds.  ?Pulmonary:  ?   Effort: Pulmonary effort is normal.  ?   Breath sounds: Normal breath sounds.  ?Musculoskeletal:  ?   Cervical back: Normal range of motion.  ?Skin: ?   General: Skin is warm and dry.  ?Neurological:  ?   General: No focal deficit present.  ?   Mental Status: She is alert and oriented to person, place, and time.  ?Psychiatric:     ?   Mood and Affect: Mood normal.     ?   Behavior: Behavior normal.     ?   Thought Content: Thought content normal.     ?   Judgment: Judgment normal.  ? ? ?BP 120/82 Comment: forearm used sys-118  Pulse 97   Temp 97.8 ?F (36.6 ?C) (Temporal)   Ht 5\' 6"  (1.676 m)   Wt (!) 474 lb 3.2 oz (215.1 kg)   SpO2 97%   BMI 76.54 kg/m?  ?Wt Readings from Last 3 Encounters:  ?05/20/21 (!) 474 lb 3.2 oz (215.1 kg)  ?04/22/21 (!) 468 lb 8 oz (212.5 kg)  ?04/01/21 (!) 482 lb 9.6 oz (218.9 kg)  ? ? ? ?Health Maintenance Due  ?Topic Date Due  ? Hepatitis C Screening  Never done  ? COVID-19 Vaccine (4 - Booster for Pfizer series) 02/22/2020  ? ? ?There are no preventive care reminders to display for this patient. ? ?Lab Results  ?Component Value Date  ? TSH 3.67 04/01/2021  ? ?Lab Results  ?Component Value Date  ? WBC 9.0 04/22/2021  ? HGB 12.4 04/22/2021  ? HCT 37.6 04/22/2021  ? MCV 88.5 04/22/2021  ? PLT 361 04/22/2021  ? ?Lab Results  ?Component Value Date  ? NA 139 04/22/2021  ? K 3.8 04/22/2021  ? CO2 27 04/22/2021  ? GLUCOSE 92 04/22/2021  ? BUN 12 04/22/2021  ? CREATININE 0.86 04/22/2021  ? BILITOT 0.5 04/22/2021  ? ALKPHOS 87 04/22/2021  ? AST 11 (L) 04/22/2021  ? ALT 13 04/22/2021  ? PROT 7.8 04/22/2021  ? ALBUMIN 3.9 04/22/2021  ? CALCIUM 9.1 04/22/2021  ? ANIONGAP 7 04/22/2021  ? GFR  117.65 04/01/2021  ? ?No results found for: CHOL ?No results found for:  HDL ?No results found for: LDLCALC ?No results found for: TRIG ?No results found for: CHOLHDL ?Lab Results  ?Component Value Date  ? HGBA1C 5.2 04/01/2021  ? ? ?  ?Assessment & Plan:  ? ?Problem List Items Addressed This Visit   ? ?  ? Other  ? Morbid obesity (HCC) - Primary  ?  She has lost 8 pounds since starting the Contrave.  Congratulated her on this!  She would like to continue the medication even with the side effects she is experiencing.  Encouraged her to take her second dose around 5 PM as she is currently taking it at 10 PM and is causing insomnia.  She can also take MiraLAX as needed for constipation.  Of note her hematologist recently started her on an iron supplement and this can increase constipation.  Discussed this with [place] today.  Encouraged her to drink plenty of fluids and she can take the iron every other day if she starts noticing worsening constipation.  Follow-up in 2 months or sooner with concerns.  Refill of Contrave sent to pharmacy ?  ?  ? Relevant Medications  ? Naltrexone-buPROPion HCl ER (CONTRAVE) 8-90 MG TB12  ? ? ?Meds ordered this encounter  ?Medications  ? Naltrexone-buPROPion HCl ER (CONTRAVE) 8-90 MG TB12  ?  Sig: Take 2 tablets in the morning and 1 tablet at 5pm  ?  Dispense:  120 tablet  ?  Refill:  0  ? ? ?Follow-up: Return in about 2 months (around 07/20/2021) for weight, CPE.  ? ? ?Gerre ScullLauren A Sulay Brymer, NP ?

## 2021-05-20 ENCOUNTER — Other Ambulatory Visit (HOSPITAL_COMMUNITY): Payer: Self-pay

## 2021-05-20 ENCOUNTER — Other Ambulatory Visit: Payer: Self-pay

## 2021-05-20 ENCOUNTER — Encounter: Payer: Self-pay | Admitting: Nurse Practitioner

## 2021-05-20 ENCOUNTER — Ambulatory Visit (INDEPENDENT_AMBULATORY_CARE_PROVIDER_SITE_OTHER): Payer: No Typology Code available for payment source | Admitting: Nurse Practitioner

## 2021-05-20 MED ORDER — CONTRAVE 8-90 MG PO TB12
ORAL_TABLET | ORAL | 0 refills | Status: DC
  Filled 2021-05-20 – 2021-06-10 (×2): qty 120, 40d supply, fill #0

## 2021-05-20 NOTE — Patient Instructions (Signed)
It was great to see you! ? ?You can start miralax 1 scoop as needed for constipation. You can get this at any pharmacy or [facility].  ? ?Continue the contrave 2 tablets in the morning and 1 tablet at 5pm ? ?Let's follow-up in 2 months, sooner if you have concerns. ? ?If a referral was placed today, you will be contacted for an appointment. Please note that routine referrals can sometimes take up to 3-4 weeks to process. Please call our office if you haven't heard anything after this time frame. ? ?Take care, ? ?Rodman Pickle, NP ? ?

## 2021-05-20 NOTE — Assessment & Plan Note (Addendum)
She has lost 8 pounds since starting the Contrave.  Congratulated her on this!  She would like to continue the medication even with the side effects she is experiencing.  Encouraged her to take her second dose around 5 PM as she is currently taking it at 10 PM and is causing insomnia.  She can also take MiraLAX as needed for constipation.  Of note her hematologist recently started her on an iron supplement and this can increase constipation.  Discussed this with Debra Walters today.  Encouraged her to drink plenty of fluids and she can take the iron every other day if she starts noticing worsening constipation.  Follow-up in 2 months or sooner with concerns.  Refill of Contrave sent to pharmacy ?

## 2021-05-21 ENCOUNTER — Other Ambulatory Visit (HOSPITAL_COMMUNITY): Payer: Self-pay

## 2021-05-29 ENCOUNTER — Other Ambulatory Visit (HOSPITAL_COMMUNITY): Payer: Self-pay

## 2021-06-10 ENCOUNTER — Other Ambulatory Visit (HOSPITAL_COMMUNITY): Payer: Self-pay

## 2021-07-01 ENCOUNTER — Other Ambulatory Visit (HOSPITAL_COMMUNITY): Payer: Self-pay

## 2021-07-18 NOTE — Progress Notes (Signed)
BP (!) 150/90 (BP Location: Right Wrist, Cuff Size: Large)   Pulse 84   Temp (!) 97.5 F (36.4 C) (Temporal)   Wt (!) 492 lb 3.2 oz (223.3 kg)   SpO2 98%   BMI 79.44 kg/m    Subjective:    Patient ID: Debra Walters, female    DOB: 10-Feb-1991, 32 y.o.   MRN: [id number]  CC: Chief Complaint  Patient presents with   Annual Exam    Pt is here for CPE    HPI: Debra Walters is a 31 y.o. female presenting on 07/19/2021 for comprehensive medical examination. Current medical complaints include: weight  She states that the Contrave was working at first, however after a few months she noticed that her appetite returned and she started eating more again.  She stopped the Contrave about a week ago.  She is interested in other medications to help with weight loss, specifically the injection.  She is also considering weight loss surgery.  She currently lives with: alone Menopausal Symptoms: no  Depression and Anxiety Screen done today and results listed below:     07/19/2021    8:54 AM 04/01/2021    4:37 PM 02/17/2017   11:36 AM 01/20/2017   11:28 AM 11/25/2016   11:28 AM  Depression screen PHQ 2/9  Decreased Interest 1 1 0 0 0  Down, Depressed, Hopeless 1 1 0 0 0  PHQ - 2 Score 2 2 0 0 0  Altered sleeping 1 1     Tired, decreased energy 1 1     Change in appetite 1 3     Feeling bad or failure about yourself  3 1     Trouble concentrating 2 1     Moving slowly or fidgety/restless 1 1     Suicidal thoughts 0 0     PHQ-9 Score 11 10     Difficult doing work/chores Somewhat difficult Somewhat difficult         07/19/2021    8:54 AM 04/01/2021    4:37 PM 02/17/2017   11:36 AM 01/20/2017   11:28 AM 11/25/2016   11:28 AM  Depression screen PHQ 2/9  Decreased Interest 1 1 0 0 0  Down, Depressed, Hopeless 1 1 0 0 0  PHQ - 2 Score 2 2 0 0 0  Altered sleeping 1 1     Tired, decreased energy 1 1     Change in appetite 1 3     Feeling bad or failure about yourself  3 1      Trouble concentrating 2 1     Moving slowly or fidgety/restless 1 1     Suicidal thoughts 0 0     PHQ-9 Score 11 10     Difficult doing work/chores Somewhat difficult Somewhat difficult       The patient does not have a history of falls. I did not complete a risk assessment for falls. A plan of care for falls was not documented.   Past Medical History:  Past Medical History:  Diagnosis Date   Asthma    GERD (gastroesophageal reflux disease)    Hypertension    Pulmonary embolism (HCC) 06/01/2020    Surgical History:  History reviewed. No pertinent surgical history.  Medications:  Current Outpatient Medications on File Prior to Visit  Medication Sig   cetirizine (ZYRTEC) 10 MG tablet Take by mouth.   famotidine (PEPCID) 20 MG tablet Take by mouth.   ferrous sulfate 325 (65 FE)  MG EC tablet Take 1 tablet (325 mg total) by mouth daily with breakfast.   levonorgestrel (KYLEENA) 19.5 MG IUD by Intrauterine route.   rivaroxaban (XARELTO) 20 MG TABS tablet Take 1 tablet (20 mg total) by mouth daily with supper.   No current facility-administered medications on file prior to visit.    Allergies:  Allergies  Allergen Reactions   Sulfa Antibiotics     Not sure   Misc. Sulfonamide Containing Compounds Rash    Other reaction(s): UNKNOWN Other reaction(s): UNKNOWN     Social History:  Social History   Socioeconomic History   Marital status: Single    Spouse name: Not on file   Number of children: Not on file   Years of education: Not on file   Highest education level: Not on file  Occupational History   Not on file  Tobacco Use   Smoking status: Never   Smokeless tobacco: Never  Vaping Use   Vaping Use: Never used  Substance and Sexual Activity   Alcohol use: Yes    Alcohol/week: 1.0 standard drink    Types: 1 drink(s) per week    Comment: monthly   Drug use: No   Sexual activity: Not on file  Other Topics Concern   Not on file  Social History Narrative    Not on file   Social Determinants of Health   Financial Resource Strain: Not on file  Food Insecurity: Not on file  Transportation Needs: Not on file  Physical Activity: Not on file  Stress: Not on file  Social Connections: Not on file  Intimate Partner Violence: Not on file   Social History   Tobacco Use  Smoking Status Never  Smokeless Tobacco Never   Social History   Substance and Sexual Activity  Alcohol Use Yes   Alcohol/week: 1.0 standard drink   Types: 1 drink(s) per week   Comment: monthly    Family History:  Family History  Problem Relation Age of Onset   Diabetes Mother    Hypertension Mother    Heart disease Father    Hypertension Father    Cancer Maternal Aunt        melanoma    Diabetes Maternal Grandmother    Cancer Maternal Grandmother        breast   Hypertension Maternal Grandmother    Hypertension Maternal Grandfather    Hypertension Paternal Grandmother    Obesity Paternal Grandfather    Hypertension Paternal Grandfather    Asthma Other    Hyperlipidemia Other    Hypertension Other    Stroke Other    Heart disease Other    Obesity Other     Past medical history, surgical history, medications, allergies, family history and social history reviewed with patient today and changes made to appropriate areas of the chart.   Review of Systems  Constitutional:  Positive for malaise/fatigue. Negative for fever.  HENT: Negative.    Eyes: Negative.   Respiratory: Negative.    Cardiovascular: Negative.   Gastrointestinal:  Positive for constipation (intermittent). Negative for abdominal pain, diarrhea and heartburn.  Genitourinary: Negative.   Musculoskeletal: Negative.   Skin: Negative.   Neurological: Negative.   Psychiatric/Behavioral:  Positive for depression. The patient is nervous/anxious.       Objective:    BP (!) 150/90 (BP Location: Right Wrist, Cuff Size: Large)   Pulse 84   Temp (!) 97.5 F (36.4 C) (Temporal)   Wt (!) 492 lb  3.2 oz (223.3 kg)  SpO2 98%   BMI 79.44 kg/m   Wt Readings from Last 3 Encounters:  07/19/21 (!) 492 lb 3.2 oz (223.3 kg)  05/20/21 (!) 474 lb 3.2 oz (215.1 kg)  04/22/21 (!) 468 lb 8 oz (212.5 kg)    Physical Exam Vitals and nursing note reviewed.  Constitutional:      General: She is not in acute distress.    Appearance: Normal appearance.  HENT:     Head: Normocephalic and atraumatic.     Right Ear: Tympanic membrane, ear canal and external ear normal.     Left Ear: Tympanic membrane, ear canal and external ear normal.  Eyes:     Conjunctiva/sclera: Conjunctivae normal.  Cardiovascular:     Rate and Rhythm: Normal rate and regular rhythm.     Pulses: Normal pulses.     Heart sounds: Normal heart sounds.  Pulmonary:     Effort: Pulmonary effort is normal.     Breath sounds: Normal breath sounds.  Abdominal:     Palpations: Abdomen is soft.     Tenderness: There is no abdominal tenderness.  Musculoskeletal:        General: Normal range of motion.     Cervical back: Normal range of motion and neck supple. No tenderness.     Right lower leg: No edema.     Left lower leg: No edema.  Lymphadenopathy:     Cervical: No cervical adenopathy.  Skin:    General: Skin is warm and dry.  Neurological:     General: No focal deficit present.     Mental Status: She is alert and oriented to person, place, and time.     Cranial Nerves: No cranial nerve deficit.     Coordination: Coordination normal.     Gait: Gait normal.  Psychiatric:        Mood and Affect: Mood normal.        Behavior: Behavior normal.        Thought Content: Thought content normal.        Judgment: Judgment normal.    Results for orders placed or performed in visit on 04/22/21  CMP ([facility] only)  Result Value Ref Range   Sodium 139 135 - 145 mmol/L   Potassium 3.8 3.5 - 5.1 mmol/L   Chloride 105 98 - 111 mmol/L   CO2 27 22 - 32 mmol/L   Glucose, Bld 92 70 - 99 mg/dL   BUN 12 6 - 20 mg/dL    Creatinine 0.86 0.44 - 1.00 mg/dL   Calcium 9.1 8.9 - 10.3 mg/dL   Total Protein 7.8 6.5 - 8.1 g/dL   Albumin 3.9 3.5 - 5.0 g/dL   AST 11 (L) 15 - 41 U/L   ALT 13 0 - 44 U/L   Alkaline Phosphatase 87 38 - 126 U/L   Total Bilirubin 0.5 0.3 - 1.2 mg/dL   GFR, Estimated >60 >60 mL/min   Anion gap 7 5 - 15  Retic Panel  Result Value Ref Range   Retic Ct Pct 2.1 0.4 - 3.1 %   RBC. 4.30 3.87 - 5.11 MIL/uL   Retic Count, Absolute 88.6 19.0 - 186.0 K/uL   Immature Retic Fract 19.8 (H) 2.3 - 15.9 %   Reticulocyte Hemoglobin 32.6 >27.9 pg  Iron and Iron Binding Capacity ([facility] only)  Result Value Ref Range   Iron 28 28 - 170 ug/dL   TIBC 346 250 - 450 ug/dL   Saturation Ratios 8 (L) 10.4 -  31.8 %   UIBC 318 148 - 442 ug/dL  Ferritin  Result Value Ref Range   Ferritin 53 11 - 307 ng/mL  CBC with Differential ([facility] Only)  Result Value Ref Range   WBC Count 9.0 4.0 - 10.5 K/uL   RBC 4.25 3.87 - 5.11 MIL/uL   Hemoglobin 12.4 12.0 - 15.0 g/dL   HCT 37.6 36.0 - 46.0 %   MCV 88.5 80.0 - 100.0 fL   MCH 29.2 26.0 - 34.0 pg   MCHC 33.0 30.0 - 36.0 g/dL   RDW 14.0 11.5 - 15.5 %   Platelet Count 361 150 - 400 K/uL   nRBC 0.0 0.0 - 0.2 %   Neutrophils Relative % 73 %   Neutro Abs 6.5 1.7 - 7.7 K/uL   Lymphocytes Relative 22 %   Lymphs Abs 1.9 0.7 - 4.0 K/uL   Monocytes Relative 4 %   Monocytes Absolute 0.4 0.1 - 1.0 K/uL   Eosinophils Relative 1 %   Eosinophils Absolute 0.1 0.0 - 0.5 K/uL   Basophils Relative 0 %   Basophils Absolute 0.0 0.0 - 0.1 K/uL   Immature Granulocytes 0 %   Abs Immature Granulocytes 0.02 0.00 - 0.07 K/uL      Assessment & Plan:   Problem List Items Addressed This Visit       Cardiovascular and Mediastinum   Primary hypertension    Chronic, elevated today.  BP today 150/90.  Last visits it has been 120s/80s.  Discussed limiting her salt in her diet and checking her blood pressure at home.  We will hold off on restarting medication since this  is the first time has been elevated in a little while.  Follow-up in 6 to 8 weeks.         Respiratory   Mild persistent asthma without complication    Chronic, stable.  Symptoms are well controlled on Advair and albuterol as needed.  She states that she had a cold last week and needed to use her albuterol little more frequently, however that is improving now.  Refill sent in on albuterol and Advair inhaler.       Relevant Medications   albuterol (VENTOLIN HFA) 108 (90 Base) MCG/ACT inhaler   fluticasone-salmeterol (ADVAIR) 100-50 MCG/ACT AEPB     Other   Morbid obesity ([place])    She has gained 18 pounds since her last visit in March.  She states that the Contrave is not helping as well as it was before.  We will start Wegovy 0.25 mg injection weekly.  Discussed possible side effects.  Encouraged her to continue tracking her food and that my fitness pal or lose it app.  She is also considering weight loss surgery if this does not help.  Goal is to lose 1 to 2 pounds per week.  Follow-up in 6 to 8 weeks.       Relevant Medications   Semaglutide-Weight Management (WEGOVY) 0.25 MG/0.5ML SOAJ   Depression, major, single episode, mild (HCC)    Chronic, stable.  She states that she is in the process of getting a therapist.  Follow-up if symptoms worsen or with any concerns.       Other Visit Diagnoses     Routine general medical examination at a health care facility    -  Primary        Follow up plan: Return in about 6 weeks (around 08/30/2021) for 6-8 weeks bp, weight management .   LABORATORY TESTING:  -  Pap smear: up to date  IMMUNIZATIONS:   - Tdap: Tetanus vaccination status reviewed: last tetanus booster within 10 years. - Influenza: Postponed to flu season - Pneumovax: Up to date - HPV: Up to date - Zostavax vaccine: Not applicable  SCREENING: -Mammogram: Not applicable  - Colonoscopy: Not applicable  - Bone Density: Not applicable  -Hearing Test: Not applicable   -Spirometry: Not applicable   PATIENT COUNSELING:   Advised to take 1 mg of folate supplement per day if capable of pregnancy.   Sexuality: Discussed sexually transmitted diseases, partner selection, use of condoms, avoidance of unintended pregnancy  and contraceptive alternatives.   Advised to avoid cigarette smoking.  I discussed with the patient that most people either abstain from alcohol or drink within safe limits (<=14/week and <=4 drinks/occasion for males, <=7/weeks and <= 3 drinks/occasion for females) and that the risk for alcohol disorders and other health effects rises proportionally with the number of drinks per week and how often a drinker exceeds daily limits.  Discussed cessation/primary prevention of drug use and availability of treatment for abuse.   Diet: Encouraged to adjust caloric intake to maintain  or achieve ideal body weight, to reduce intake of dietary saturated fat and total fat, to limit sodium intake by avoiding high sodium foods and not adding table salt, and to maintain adequate dietary potassium and calcium preferably from fresh fruits, vegetables, and low-fat dairy products.    stressed the importance of regular exercise  Injury prevention: Discussed safety belts, safety helmets, smoke detector, smoking near bedding or upholstery.   Dental health: Discussed importance of regular tooth brushing, flossing, and dental visits.    NEXT PREVENTATIVE PHYSICAL DUE IN 1 YEAR. Return in about 6 weeks (around 08/30/2021) for 6-8 weeks bp, weight management .

## 2021-07-19 ENCOUNTER — Other Ambulatory Visit (HOSPITAL_COMMUNITY): Payer: Self-pay

## 2021-07-19 ENCOUNTER — Ambulatory Visit (INDEPENDENT_AMBULATORY_CARE_PROVIDER_SITE_OTHER): Payer: No Typology Code available for payment source | Admitting: Nurse Practitioner

## 2021-07-19 ENCOUNTER — Encounter: Payer: Self-pay | Admitting: Nurse Practitioner

## 2021-07-19 VITALS — BP 150/90 | HR 84 | Temp 97.5°F | Wt >= 6400 oz

## 2021-07-19 DIAGNOSIS — F320 Major depressive disorder, single episode, mild: Secondary | ICD-10-CM

## 2021-07-19 DIAGNOSIS — J4530 Mild persistent asthma, uncomplicated: Secondary | ICD-10-CM | POA: Diagnosis not present

## 2021-07-19 DIAGNOSIS — I10 Essential (primary) hypertension: Secondary | ICD-10-CM

## 2021-07-19 DIAGNOSIS — Z0000 Encounter for general adult medical examination without abnormal findings: Secondary | ICD-10-CM

## 2021-07-19 MED ORDER — ALBUTEROL SULFATE HFA 108 (90 BASE) MCG/ACT IN AERS
1.0000 | INHALATION_SPRAY | Freq: Four times a day (QID) | RESPIRATORY_TRACT | 3 refills | Status: DC | PRN
  Filled 2021-07-19: qty 18, 25d supply, fill #0
  Filled 2021-12-09: qty 6.7, 17d supply, fill #1
  Filled 2022-02-22: qty 18, 25d supply, fill #2
  Filled 2022-06-16: qty 6.7, 17d supply, fill #2

## 2021-07-19 MED ORDER — WEGOVY 0.25 MG/0.5ML ~~LOC~~ SOAJ
0.2500 mg | SUBCUTANEOUS | 0 refills | Status: DC
  Filled 2021-07-19: qty 2, 28d supply, fill #0

## 2021-07-19 MED ORDER — FLUTICASONE-SALMETEROL 100-50 MCG/ACT IN AEPB
1.0000 | INHALATION_SPRAY | Freq: Two times a day (BID) | RESPIRATORY_TRACT | 3 refills | Status: DC
  Filled 2021-07-19: qty 60, 30d supply, fill #0
  Filled 2021-10-29: qty 60, 30d supply, fill #1
  Filled 2021-12-09: qty 60, 30d supply, fill #2
  Filled 2022-02-22: qty 60, 30d supply, fill #3

## 2021-07-19 NOTE — Assessment & Plan Note (Signed)
She has gained 18 pounds since her last visit in March.  She states that the Contrave is not helping as well as it was before.  We will start Wegovy 0.25 mg injection weekly.  Discussed possible side effects.  Encouraged her to continue tracking her food and that my fitness pal or lose it app.  She is also considering weight loss surgery if this does not help.  Goal is to lose 1 to 2 pounds per week.  Follow-up in 6 to 8 weeks.

## 2021-07-19 NOTE — Patient Instructions (Signed)
It was great to see you!  Start wegovy injection once a week.   I have refilled your inhalers  Let's follow-up in 6-8 weeks, sooner if you have concerns.  If a referral was placed today, you will be contacted for an appointment. Please note that routine referrals can sometimes take up to 3-4 weeks to process. Please call our office if you haven't heard anything after this time frame.  Take care,  Rodman Pickle, NP

## 2021-07-19 NOTE — Assessment & Plan Note (Signed)
Chronic, elevated today.  BP today 150/90.  Last visits it has been 120s/80s.  Discussed limiting her salt in her diet and checking her blood pressure at home.  We will hold off on restarting medication since this is the first time has been elevated in a little while.  Follow-up in 6 to 8 weeks.

## 2021-07-19 NOTE — Assessment & Plan Note (Signed)
Chronic, stable.  She states that she is in the process of getting a therapist.  Follow-up if symptoms worsen or with any concerns.

## 2021-07-19 NOTE — Assessment & Plan Note (Signed)
Chronic, stable.  Symptoms are well controlled on Advair and albuterol as needed.  She states that she had a cold last week and needed to use her albuterol little more frequently, however that is improving now.  Refill sent in on albuterol and Advair inhaler.

## 2021-07-24 NOTE — Progress Notes (Signed)
PA STARTEDReginal Walters  DATE: 07/24/21  TIME: 0904  TAT OF DETERMINATION: 48-72HRS  PAT NOTIFIED?  N/A

## 2021-07-25 ENCOUNTER — Telehealth: Payer: Self-pay

## 2021-07-25 NOTE — Telephone Encounter (Signed)
PA approved for [facility]. Patient notified via [employer]. Sw, cma

## 2021-07-30 ENCOUNTER — Encounter: Payer: Self-pay | Admitting: Nurse Practitioner

## 2021-07-31 ENCOUNTER — Other Ambulatory Visit (HOSPITAL_COMMUNITY): Payer: Self-pay

## 2021-07-31 MED ORDER — METRONIDAZOLE 500 MG PO TABS
500.0000 mg | ORAL_TABLET | Freq: Two times a day (BID) | ORAL | 0 refills | Status: AC
  Filled 2021-07-31: qty 14, 7d supply, fill #0

## 2021-08-06 ENCOUNTER — Other Ambulatory Visit (HOSPITAL_COMMUNITY): Payer: Self-pay

## 2021-08-07 ENCOUNTER — Other Ambulatory Visit (HOSPITAL_COMMUNITY): Payer: Self-pay

## 2021-08-21 ENCOUNTER — Other Ambulatory Visit (HOSPITAL_COMMUNITY): Payer: Self-pay

## 2021-08-23 ENCOUNTER — Ambulatory Visit (INDEPENDENT_AMBULATORY_CARE_PROVIDER_SITE_OTHER): Payer: No Typology Code available for payment source | Admitting: Psychology

## 2021-08-23 DIAGNOSIS — F411 Generalized anxiety disorder: Secondary | ICD-10-CM | POA: Diagnosis not present

## 2021-08-23 DIAGNOSIS — F331 Major depressive disorder, recurrent, moderate: Secondary | ICD-10-CM

## 2021-08-25 ENCOUNTER — Other Ambulatory Visit: Payer: Self-pay | Admitting: Nurse Practitioner

## 2021-08-26 ENCOUNTER — Encounter (INDEPENDENT_AMBULATORY_CARE_PROVIDER_SITE_OTHER): Payer: Self-pay

## 2021-08-26 ENCOUNTER — Other Ambulatory Visit (HOSPITAL_COMMUNITY): Payer: Self-pay

## 2021-08-26 MED ORDER — WEGOVY 0.25 MG/0.5ML ~~LOC~~ SOAJ
0.2500 mg | SUBCUTANEOUS | 0 refills | Status: DC
  Filled 2021-08-26: qty 2, 28d supply, fill #0

## 2021-08-28 ENCOUNTER — Ambulatory Visit (INDEPENDENT_AMBULATORY_CARE_PROVIDER_SITE_OTHER): Payer: No Typology Code available for payment source | Admitting: Psychology

## 2021-08-28 DIAGNOSIS — F411 Generalized anxiety disorder: Secondary | ICD-10-CM

## 2021-08-28 DIAGNOSIS — F331 Major depressive disorder, recurrent, moderate: Secondary | ICD-10-CM
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
## 2021-08-30 ENCOUNTER — Ambulatory Visit: Payer: No Typology Code available for payment source | Admitting: Nurse Practitioner

## 2021-09-02 NOTE — Progress Notes (Deleted)
   Established Patient Office Visit  Subjective   Patient ID: Amantha Sklar, female    DOB: 1991/02/05  Age: 31 y.o. MRN: [id number]  No chief complaint on file.   HPI  Kristopher Dolney is here to follow-up on elevated blood pressure and weight management. She was started on Wegovy 0.25mg  injection last visit.   {History (Optional):23778}  ROS    Objective:     There were no vitals taken for this visit. {Vitals History (Optional):23777}  Physical Exam   No results found for any visits on 09/04/21.  {Labs (Optional):23779}  The ASCVD Risk score (Arnett DK, et al., 2019) failed to calculate for the following reasons:   The 2019 ASCVD risk score is only valid for ages 43 to 47    Assessment & Plan:   Problem List Items Addressed This Visit   None   No follow-ups on file.    Gerre Scull, NP

## 2021-09-04 ENCOUNTER — Other Ambulatory Visit (HOSPITAL_COMMUNITY): Payer: Self-pay

## 2021-09-04 ENCOUNTER — Ambulatory Visit: Payer: No Typology Code available for payment source | Admitting: Nurse Practitioner

## 2021-09-05 ENCOUNTER — Other Ambulatory Visit (HOSPITAL_COMMUNITY): Payer: Self-pay

## 2021-09-05 NOTE — Progress Notes (Signed)
Established Patient Office Visit  Subjective   Patient ID: Debra Walters, female    DOB: 08-12-1990  Age: 31 y.o. MRN: [id number]  Chief Complaint  Patient presents with   Follow-up    6 wk f/u weight management     HPI  Debra Walters is here to follow-up on weight management and elevated blood pressure. She is currently taking Wegovy 0.25mg  weekly injection. She also has an appointment with [facility] on 10/02/21.   She states that the [facility] injections are going well.  She does have a little bit of nausea and acid reflux with taking this, however the symptoms are mild.  She has been adjusting her diet, limiting her portion sizes.  She states that she also did walk a few times since her last visit.  She has not been checking her blood pressure at home.  She was on hydrochlorothiazide in the past for blood pressure control.  She endorses some shortness of breath with exertion.  She denies headaches.    ROS See pertinent positives and negatives per HPI.    Objective:     BP (!) 140/100 (BP Location: Right Wrist, Cuff Size: Large)   Pulse 97   Temp 98 F (36.7 C) (Temporal)   Wt (!) 480 lb (217.7 kg)   SpO2 98%   BMI 77.47 kg/m  BP Readings from Last 3 Encounters:  09/06/21 (!) 140/100  07/19/21 (!) 150/90  05/20/21 120/82   Wt Readings from Last 3 Encounters:  09/06/21 (!) 480 lb (217.7 kg)  07/19/21 (!) 492 lb 3.2 oz (223.3 kg)  05/20/21 (!) 474 lb 3.2 oz (215.1 kg)    Physical Exam Vitals and nursing note reviewed.  Constitutional:      General: She is not in acute distress.    Appearance: Normal appearance.  HENT:     Head: Normocephalic.  Eyes:     Conjunctiva/sclera: Conjunctivae normal.  Cardiovascular:     Rate and Rhythm: Normal rate and regular rhythm.     Pulses: Normal pulses.     Heart sounds: Normal heart sounds.  Pulmonary:     Effort: Pulmonary effort is normal.     Breath sounds: Normal breath sounds.   Musculoskeletal:     Cervical back: Normal range of motion.  Skin:    General: Skin is warm.  Neurological:     General: No focal deficit present.     Mental Status: She is alert and oriented to person, place, and time.  Psychiatric:        Mood and Affect: Mood normal.        Behavior: Behavior normal.        Thought Content: Thought content normal.        Judgment: Judgment normal.       Assessment & Plan:   Problem List Items Addressed This Visit       Cardiovascular and Mediastinum   Primary hypertension - Primary    Chronic, not controlled. Blood pressure is elevated again this visit at 140/100.  She was taking hydrochlorothiazide in the past, will restart her on hydrochlorothiazide 25 mg daily.  Encouraged her to start checking her blood pressure at home.  Follow-up in 4 weeks.      Relevant Medications   hydrochlorothiazide (HYDRODIURIL) 25 MG tablet     Other   Morbid obesity (HCC)    She has lost 12 pounds since her last visit, congratulated her on this.  We will increase  her Wegovy to 0.5 mg injection weekly.  With the acid reflux she is having, she has been taking Pepcid as needed.  Advised her that she could start this daily to help prevent symptoms.  Continue limiting portion sizes and exercising.  She has an appointment with [facility] in August.      Relevant Medications   Semaglutide-Weight Management (WEGOVY) 0.5 MG/0.5ML SOAJ    Return in about 4 weeks (around 10/04/2021) for HTN.    Gerre Scull, NP

## 2021-09-06 ENCOUNTER — Ambulatory Visit (INDEPENDENT_AMBULATORY_CARE_PROVIDER_SITE_OTHER): Payer: No Typology Code available for payment source | Admitting: Nurse Practitioner

## 2021-09-06 ENCOUNTER — Ambulatory Visit: Payer: No Typology Code available for payment source | Admitting: Nurse Practitioner

## 2021-09-06 ENCOUNTER — Other Ambulatory Visit (HOSPITAL_COMMUNITY): Payer: Self-pay

## 2021-09-06 ENCOUNTER — Encounter: Payer: Self-pay | Admitting: Nurse Practitioner

## 2021-09-06 VITALS — BP 140/100 | HR 97 | Temp 98.0°F | Wt >= 6400 oz

## 2021-09-06 DIAGNOSIS — I10 Essential (primary) hypertension: Secondary | ICD-10-CM

## 2021-09-06 MED ORDER — HYDROCHLOROTHIAZIDE 25 MG PO TABS
25.0000 mg | ORAL_TABLET | Freq: Every day | ORAL | 0 refills | Status: DC
  Filled 2021-09-06: qty 90, 90d supply, fill #0

## 2021-09-06 MED ORDER — WEGOVY 0.5 MG/0.5ML ~~LOC~~ SOAJ
0.5000 mg | SUBCUTANEOUS | 0 refills | Status: DC
  Filled 2021-09-06: qty 2, 28d supply, fill #0

## 2021-09-06 NOTE — Assessment & Plan Note (Signed)
She has lost 12 pounds since her last visit, congratulated her on this.  We will increase her Wegovy to 0.5 mg injection weekly.  With the acid reflux she is having, she has been taking Pepcid as needed.  Advised her that she could start this daily to help prevent symptoms.  Continue limiting portion sizes and exercising.  She has an appointment with [facility] in August.

## 2021-09-06 NOTE — Patient Instructions (Signed)
It was great to see you!  Keep up the great work!   I have refilled your wegovy at the 0.5mg  weekly injection dose.   I sent in hydrochlorothiazide to take once a day for your blood pressure.   Let's follow-up in 4 weeks, sooner if you have concerns.  If a referral was placed today, you will be contacted for an appointment. Please note that routine referrals can sometimes take up to 3-4 weeks to process. Please call our office if you haven't heard anything after this time frame.  Take care,  Rodman Pickle, NP

## 2021-09-06 NOTE — Assessment & Plan Note (Addendum)
Chronic, not controlled. Blood pressure is elevated again this visit at 140/100.  She was taking hydrochlorothiazide in the past, will restart her on hydrochlorothiazide 25 mg daily.  Encouraged her to start checking her blood pressure at home.  Follow-up in 4 weeks.

## 2021-09-11 ENCOUNTER — Ambulatory Visit (INDEPENDENT_AMBULATORY_CARE_PROVIDER_SITE_OTHER): Payer: No Typology Code available for payment source | Admitting: Psychology

## 2021-09-11 DIAGNOSIS — F331 Major depressive disorder, recurrent, moderate: Secondary | ICD-10-CM

## 2021-09-11 DIAGNOSIS — F411 Generalized anxiety disorder: Secondary | ICD-10-CM
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
## 2021-09-18 ENCOUNTER — Other Ambulatory Visit (HOSPITAL_COMMUNITY): Payer: Self-pay

## 2021-09-27 ENCOUNTER — Ambulatory Visit (INDEPENDENT_AMBULATORY_CARE_PROVIDER_SITE_OTHER): Payer: No Typology Code available for payment source | Admitting: Psychology

## 2021-09-27 DIAGNOSIS — F331 Major depressive disorder, recurrent, moderate: Secondary | ICD-10-CM

## 2021-09-27 DIAGNOSIS — F411 Generalized anxiety disorder: Secondary | ICD-10-CM | POA: Diagnosis not present
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
## 2021-10-01 ENCOUNTER — Ambulatory Visit (INDEPENDENT_AMBULATORY_CARE_PROVIDER_SITE_OTHER): Payer: No Typology Code available for payment source | Admitting: Psychology

## 2021-10-01 DIAGNOSIS — F411 Generalized anxiety disorder: Secondary | ICD-10-CM | POA: Diagnosis not present

## 2021-10-01 DIAGNOSIS — F331 Major depressive disorder, recurrent, moderate: Secondary | ICD-10-CM | POA: Diagnosis not present
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
## 2021-10-02 ENCOUNTER — Encounter (INDEPENDENT_AMBULATORY_CARE_PROVIDER_SITE_OTHER): Payer: Self-pay | Admitting: Family Medicine

## 2021-10-02 ENCOUNTER — Ambulatory Visit (INDEPENDENT_AMBULATORY_CARE_PROVIDER_SITE_OTHER): Payer: No Typology Code available for payment source | Admitting: Family Medicine

## 2021-10-02 VITALS — BP 125/65 | HR 82 | Temp 98.1°F | Ht 67.0 in | Wt >= 6400 oz

## 2021-10-02 DIAGNOSIS — R739 Hyperglycemia, unspecified: Secondary | ICD-10-CM

## 2021-10-02 DIAGNOSIS — F320 Major depressive disorder, single episode, mild: Secondary | ICD-10-CM | POA: Diagnosis not present

## 2021-10-02 DIAGNOSIS — R0602 Shortness of breath: Secondary | ICD-10-CM | POA: Diagnosis not present

## 2021-10-02 DIAGNOSIS — Z86711 Personal history of pulmonary embolism: Secondary | ICD-10-CM

## 2021-10-02 DIAGNOSIS — Z6841 Body Mass Index (BMI) 40.0 and over, adult: Secondary | ICD-10-CM | POA: Diagnosis not present

## 2021-10-02 DIAGNOSIS — R5383 Other fatigue: Secondary | ICD-10-CM | POA: Diagnosis not present

## 2021-10-02 DIAGNOSIS — Z0289 Encounter for other administrative examinations: Secondary | ICD-10-CM

## 2021-10-02 DIAGNOSIS — I10 Essential (primary) hypertension: Secondary | ICD-10-CM

## 2021-10-03 LAB — COMPREHENSIVE METABOLIC PANEL
ALT: 30 IU/L (ref 0–32)
AST: 22 IU/L (ref 0–40)
Albumin/Globulin Ratio: 1.2 (ref 1.2–2.2)
Albumin: 4.2 g/dL (ref 3.9–4.9)
Alkaline Phosphatase: 112 IU/L (ref 44–121)
BUN/Creatinine Ratio: 13 (ref 9–23)
BUN: 11 mg/dL (ref 6–20)
Bilirubin Total: 0.4 mg/dL (ref 0.0–1.2)
CO2: 25 mmol/L (ref 20–29)
Calcium: 9.7 mg/dL (ref 8.7–10.2)
Chloride: 98 mmol/L (ref 96–106)
Creatinine, Ser: 0.85 mg/dL (ref 0.57–1.00)
Globulin, Total: 3.5 g/dL (ref 1.5–4.5)
Glucose: 82 mg/dL (ref 70–99)
Potassium: 4 mmol/L (ref 3.5–5.2)
Sodium: 139 mmol/L (ref 134–144)
Total Protein: 7.7 g/dL (ref 6.0–8.5)
eGFR: 94 mL/min/{1.73_m2} (ref >59)

## 2021-10-03 LAB — CBC WITH DIFFERENTIAL/PLATELET
Basophils Absolute: 0 10*3/uL (ref 0.0–0.2)
Basos: 1 %
EOS (ABSOLUTE): 0.1 10*3/uL (ref 0.0–0.4)
Eos: 2 %
Hematocrit: 42 % (ref 34.0–46.6)
Hemoglobin: 13.6 g/dL (ref 11.1–15.9)
Immature Grans (Abs): 0 10*3/uL (ref 0.0–0.1)
Immature Granulocytes: 0 %
Lymphocytes Absolute: 1.7 10*3/uL (ref 0.7–3.1)
Lymphs: 23 %
MCH: 29.4 pg (ref 26.6–33.0)
MCHC: 32.4 g/dL (ref 31.5–35.7)
MCV: 91 fL (ref 79–97)
Monocytes Absolute: 0.4 10*3/uL (ref 0.1–0.9)
Monocytes: 5 %
Neutrophils Absolute: 5 10*3/uL (ref 1.4–7.0)
Neutrophils: 69 %
Platelets: 367 10*3/uL (ref 150–450)
RBC: 4.62 x10E6/uL (ref 3.77–5.28)
RDW: 14.1 % (ref 11.7–15.4)
WBC: 7.2 10*3/uL (ref 3.4–10.8)

## 2021-10-03 LAB — LIPID PANEL WITH LDL/HDL RATIO
Cholesterol, Total: 150 mg/dL (ref 100–199)
HDL: 46 mg/dL (ref >39)
LDL Chol Calc (NIH): 87 mg/dL (ref 0–99)
LDL/HDL Ratio: 1.9 ratio (ref 0.0–3.2)
Triglycerides: 91 mg/dL (ref 0–149)
VLDL Cholesterol Cal: 17 mg/dL (ref 5–40)

## 2021-10-03 LAB — T3: T3, Total: 155 ng/dL (ref 71–180)

## 2021-10-03 LAB — HEMOGLOBIN A1C
Est. average glucose Bld gHb Est-mCnc: 100 mg/dL
Hgb A1c MFr Bld: 5.1 % (ref 4.8–5.6)

## 2021-10-03 LAB — VITAMIN D 25 HYDROXY (VIT D DEFICIENCY, FRACTURES): Vit D, 25-Hydroxy: 19.6 ng/mL — ABNORMAL LOW (ref 30.0–100.0)

## 2021-10-03 LAB — VITAMIN B12: Vitamin B-12: 663 pg/mL (ref 232–1245)

## 2021-10-03 LAB — FOLATE: Folate: 7.4 ng/mL (ref >3.0)

## 2021-10-03 LAB — INSULIN, RANDOM: INSULIN: 30.4 u[IU]/mL — ABNORMAL HIGH (ref 2.6–24.9)

## 2021-10-03 LAB — TSH: TSH: 1.33 u[IU]/mL (ref 0.450–4.500)

## 2021-10-03 LAB — T4, FREE: Free T4: 1.42 ng/dL (ref 0.82–1.77)

## 2021-10-04 ENCOUNTER — Other Ambulatory Visit (HOSPITAL_COMMUNITY): Payer: Self-pay

## 2021-10-04 ENCOUNTER — Encounter: Payer: Self-pay | Admitting: Nurse Practitioner

## 2021-10-04 ENCOUNTER — Ambulatory Visit (INDEPENDENT_AMBULATORY_CARE_PROVIDER_SITE_OTHER): Payer: No Typology Code available for payment source | Admitting: Nurse Practitioner

## 2021-10-04 VITALS — BP 126/80 | HR 98 | Wt >= 6400 oz

## 2021-10-04 DIAGNOSIS — K219 Gastro-esophageal reflux disease without esophagitis: Secondary | ICD-10-CM

## 2021-10-04 DIAGNOSIS — I10 Essential (primary) hypertension: Secondary | ICD-10-CM

## 2021-10-04 MED ORDER — PANTOPRAZOLE SODIUM 40 MG PO TBEC
40.0000 mg | DELAYED_RELEASE_TABLET | Freq: Every day | ORAL | 1 refills | Status: DC
  Filled 2021-10-04: qty 90, 90d supply, fill #0
  Filled 2022-01-15 – 2022-01-29 (×2): qty 90, 90d supply, fill #1

## 2021-10-04 NOTE — Assessment & Plan Note (Signed)
Chronic, stable. Will continue HCTZ 25mg  daily. Last CMP reviewed from 10/02/21. Follow-up in 3 months.

## 2021-10-04 NOTE — Patient Instructions (Signed)
It was great to see you!  You can start protonix once a day for acid reflux. You can also keep taking your pepcid as needed.   Let's follow-up in 3 months, sooner if you have concerns.  If a referral was placed today, you will be contacted for an appointment. Please note that routine referrals can sometimes take up to 3-4 weeks to process. Please call our office if you haven't heard anything after this time frame.  Take care,  Rodman Pickle, NP

## 2021-10-04 NOTE — Assessment & Plan Note (Signed)
Chronic, not controlled. With [facility], she has noticed an increase in acid reflux. Will start protonix 40mg  daily. She can also continue pepcid 20mg  prn. Follow-up in 3 months.

## 2021-10-04 NOTE — Progress Notes (Signed)
Established Patient Office Visit  Subjective   Patient ID: Debra Walters, female    DOB: February 05, 1991  Age: 31 y.o. MRN: [id number]  Chief Complaint  Patient presents with   Follow-up    4 wk f/u HTN    HPI  Debra Walters is here to follow-up on hypertension. She was started on hctz 25mg  last visit. She denies any side effects to the medications. She has not been checking her blood pressure at home. She denies chest pain, shortness of breath, and headaches.   She has continued making dietary changes and is taking wegovy 0.5mg  weekly. [facility]. She has lost 9 pounds since her last visit. She has noticed an increase in her acid reflux symptoms. She has been taking pepcid daily, however is still having symptoms.     ROS See pertinent positives and negatives per HPI.    Objective:     BP 126/80 (BP Location: Right Wrist, Cuff Size: Large)   Pulse 98   Wt (!) 471 lb (213.6 kg)   LMP 09/18/2021   SpO2 100%   BMI 73.77 kg/m  BP Readings from Last 3 Encounters:  10/04/21 126/80  10/02/21 125/65  09/06/21 (!) 140/100   Wt Readings from Last 3 Encounters:  10/04/21 (!) 471 lb (213.6 kg)  10/02/21 (!) 463 lb (210 kg)  09/06/21 (!) 480 lb (217.7 kg)     Physical Exam Vitals and nursing note reviewed.  Constitutional:      General: She is not in acute distress.    Appearance: Normal appearance.  HENT:     Head: Normocephalic.  Eyes:     Conjunctiva/sclera: Conjunctivae normal.  Cardiovascular:     Rate and Rhythm: Normal rate and regular rhythm.     Pulses: Normal pulses.     Heart sounds: Normal heart sounds.  Pulmonary:     Effort: Pulmonary effort is normal.     Breath sounds: Normal breath sounds.  Musculoskeletal:     Cervical back: Normal range of motion.  Skin:    General: Skin is warm.  Neurological:     General: No focal deficit present.     Mental Status: She is alert and oriented to  person, place, and time.  Psychiatric:        Mood and Affect: Mood normal.        Behavior: Behavior normal.        Thought Content: Thought content normal.        Judgment: Judgment normal.      Assessment & Plan:   Problem List Items Addressed This Visit       Cardiovascular and Mediastinum   Primary hypertension - Primary    Chronic, stable. Will continue HCTZ 25mg  daily. Last CMP reviewed from 10/02/21. Follow-up in 3 months.         Digestive   GERD (gastroesophageal reflux disease)    Chronic, not controlled. With , she has noticed an increase in acid reflux. Will start protonix 40mg  daily. She can also continue pepcid 20mg  prn. Follow-up in 3 months.       Relevant Medications   pantoprazole (PROTONIX) 40 MG tablet     Other   Morbid obesity (HCC)    She has lost another 9 pounds, congratulated her on this! She has established with [facility]. Will continue collaboration and recommendations.        Return in about 3  months (around 01/04/2022) for HTN, weight management.    Gerre Scull, NP

## 2021-10-04 NOTE — Assessment & Plan Note (Signed)
She has lost another 9 pounds, congratulated her on this! She has established with [facility]. Will continue collaboration and recommendations.

## 2021-10-09 ENCOUNTER — Encounter (INDEPENDENT_AMBULATORY_CARE_PROVIDER_SITE_OTHER): Payer: Self-pay

## 2021-10-11 ENCOUNTER — Ambulatory Visit (INDEPENDENT_AMBULATORY_CARE_PROVIDER_SITE_OTHER): Payer: No Typology Code available for payment source | Admitting: Psychology

## 2021-10-11 DIAGNOSIS — F331 Major depressive disorder, recurrent, moderate: Secondary | ICD-10-CM

## 2021-10-11 DIAGNOSIS — F411 Generalized anxiety disorder: Secondary | ICD-10-CM
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
## 2021-10-14 NOTE — Progress Notes (Signed)
Chief Complaint:   OBESITY Debra Walters (MR# [id number]) is a 31 y.o. female who presents for evaluation and treatment of obesity and related comorbidities. Current BMI is Body mass index is 72.52 kg/m. Debra Walters has been struggling with her weight for many years and has been unsuccessful in either losing weight, maintaining weight loss, or reaching her healthy weight goal.  Debra Walters heard about this clinic from a coworker. She is currently on Wegovy 10.5 mg--this is her 2nd month. 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 She eats out at dinner--take out or drive thru. Skips breakfast and lunch (even before [facility]).  8 am, she may eat out breakfast--breakfast sandwich, ham and cheese croissant and coffee (quad espresso shot and Premier shake/sugar free vanilla. (Satisfied). 12 pm, lunch may or may not eat--sandwich or leftovers, [employer] ([place] [employer]). Dinner--cowboy caviar with tortilla chips or pasta (2 cups) or Pho. Snacks are, cheese slices or block or 1 pint or row of Oreos, chips, cereal.  Debra Walters is currently in the action stage of change and ready to dedicate time achieving and maintaining a healthier weight. Debra Walters is interested in becoming our patient and working on intensive lifestyle modifications including (but not limited to) diet and exercise for weight loss.  Debra Walters's habits were reviewed today and are as follows: Her family eats meals together, she thinks her family will eat healthier with her, she has been heavy most of her life, she started gaining weight in the second grade, her heaviest weight ever was 492 lbs pounds, she has significant food cravings issues, she snacks frequently in the evenings, she skips meals frequently, she is frequently drinking liquids with calories, she frequently makes poor food choices, she has problems with excessive hunger, she frequently eats larger portions than normal, she has binge eating behaviors, and she struggles with  emotional eating.  Depression Screen Debra Walters's Food and Mood (modified PHQ-9) score was 18.     10/02/2021    8:36 AM  Depression screen PHQ 2/9  Decreased Interest 3  Down, Depressed, Hopeless 3  PHQ - 2 Score 6  Altered sleeping 1  Tired, decreased energy 2  Change in appetite 3  Feeling bad or failure about yourself  3  Trouble concentrating 3  Moving slowly or fidgety/restless 0  Suicidal thoughts 0  PHQ-9 Score 18  Difficult doing work/chores Somewhat difficult   Subjective:   1. Other fatigue Debra Walters admits to daytime somnolence and  sometimes, (it varies)  waking up still tired. Patient has a history of symptoms of morning fatigue. Debra Walters generally gets 6 or 7 hours of sleep per night, and states that she has generally restful sleep. Snoring is present. Apneic episodes are not present. Epworth Sleepiness Score is 4.   2. SOBOE (shortness of breath on exertion) Debra Walters notes increasing shortness of breath with exercising and seems to be worsening over time with weight gain. She notes getting out of breath sooner with activity than she used to. This has not gotten worse recently. Debra Walters denies shortness of breath at rest or orthopnea. NSR at 87 BPM; poor R wave progression.  3. Essential hypertension Debra Walters's blood pressure is controlled today. Denies chest pain, chest pressure and headache. She was just recently put back on HCTZ.  4. History of pulmonary embolism Debra Walters is currently taking Xarelto and she sees hematology/oncology.  5. Hyperglycemia Debra Walters's blood sugar on metabolic panels are elevated.  6. Depression, major, single episode, mild (HCC) Debra Walters denies suicidal  ideas, and homicidal ideas. She struggles with some self deprecation and self judgement.  Assessment/Plan:   1. Other fatigue Debra Walters does feel that her weight is causing her energy to be lower than it should be. Fatigue may be related to obesity, depression or many other causes. Labs will be ordered,  and in the meanwhile, Debra Walters will focus on self care including making healthy food choices, increasing physical activity and focusing on stress reduction.  - EKG 12-Lead - Vitamin B12 - Folate - VITAMIN D 25 Hydroxy (Vit-D Deficiency, Fractures) - TSH - T4, free - T3  2. SOBOE (shortness of breath on exertion) Debra Walters does feel that she gets out of breath more easily that she used to when she exercises. Debra Walters's shortness of breath appears to be obesity related and exercise induced. She has agreed to work on weight loss and gradually increase exercise to treat her exercise induced shortness of breath. Will continue to monitor closely.  - CBC with Differential/Platelet - Hemoglobin A1c - Insulin, random - Lipid Panel With LDL/HDL Ratio  3. Essential hypertension We will obtain labs today.  - Comprehensive metabolic panel  4. History of pulmonary embolism Debra Walters will follow up with upcoming appointment to hematology/oncology.  5. Hyperglycemia We will obtain labs today.  - Hemoglobin A1c - Insulin, random  6. Depression, major, single episode, mild (HCC) We will follow up with Debra Walters's symptoms at next appointment.  7. Class 3 severe obesity with serious comorbidity and body mass index (BMI) greater than or equal to 70 in adult, unspecified obesity type (HCC) Debra Walters is currently in the action stage of change and her goal is to continue with weight loss efforts. Debra Walters recommend Debra Walters begin the structured treatment plan as follows:  She has agreed to the Category 4 Plan +200.  Exercise goals: No exercise has been prescribed at this time.   Behavioral modification strategies: increasing lean protein intake, meal planning and cooking strategies, keeping healthy foods in the home, and planning for success.  She was informed of the importance of frequent follow-up visits to maximize her success with intensive lifestyle modifications for her multiple health conditions. She was informed  we would discuss her lab results at her next visit unless there is a critical issue that needs to be addressed sooner. Debra Walters agreed to keep her next visit at the agreed upon time to discuss these results.  Objective:   Blood pressure 125/65, pulse 82, temperature 98.1 F (36.7 C), height 5\' 7"  (1.702 m), weight (!) 463 lb (210 kg), last menstrual period 09/18/2021, SpO2 98 %. Body mass index is 72.52 kg/m.  EKG: Normal sinus rhythm, rate 87 BPM.  Indirect Calorimeter completed today shows a VO2 of 384 and a REE of 2650.  Her calculated basal metabolic rate is 09/20/2021 thus her basal metabolic rate is better than expected.  General: Cooperative, alert, well developed, in no acute distress. HEENT: Conjunctivae and lids unremarkable. Cardiovascular: Regular rhythm.  Lungs: Normal work of breathing. Neurologic: No focal deficits.   Lab Results  Component Value Date   CREATININE 0.85 10/02/2021   BUN 11 10/02/2021   NA 139 10/02/2021   K 4.0 10/02/2021   CL 98 10/02/2021   CO2 25 10/02/2021   Lab Results  Component Value Date   ALT 30 10/02/2021   AST 22 10/02/2021   ALKPHOS 112 10/02/2021   BILITOT 0.4 10/02/2021   Lab Results  Component Value Date   HGBA1C 5.1 10/02/2021   HGBA1C 5.2 04/01/2021  Lab Results  Component Value Date   INSULIN 30.4 (H) 10/02/2021   Lab Results  Component Value Date   TSH 1.330 10/02/2021   Lab Results  Component Value Date   CHOL 150 10/02/2021   HDL 46 10/02/2021   LDLCALC 87 10/02/2021   TRIG 91 10/02/2021   Lab Results  Component Value Date   WBC 7.2 10/02/2021   HGB 13.6 10/02/2021   HCT 42.0 10/02/2021   MCV 91 10/02/2021   PLT 367 10/02/2021   Lab Results  Component Value Date   IRON 28 04/22/2021   TIBC 346 04/22/2021   FERRITIN 53 04/22/2021   Attestation Statements:   Reviewed by clinician on day of visit: allergies, medications, problem list, medical history, surgical history, family history, social history, and  previous encounter notes.  Time spent on visit including pre-visit chart review and post-visit charting and care was 50 minutes.   Debra Walters, Fortino Sic, RMA am acting as transcriptionist for Reuben Likes, MD.  This is the patient's first visit at [facility]. The patient's NEW PATIENT PACKET was reviewed at length. Included in the packet: current and past health history, medications, allergies, ROS, gynecologic history (women only), surgical history, family history, social history, weight history, weight loss surgery history (for those that have had weight loss surgery), nutritional evaluation, mood and food questionnaire, PHQ9, Epworth questionnaire, sleep habits questionnaire, patient life and health improvement goals questionnaire. These will all be scanned into the patient's chart under media.   During the visit, Debra Walters independently reviewed the patient's EKG, bioimpedance scale results, and indirect calorimeter results. Debra Walters used this information to tailor a meal plan for the patient that will help her to lose weight and will improve her obesity-related conditions going forward. Debra Walters performed a medically necessary appropriate examination and/or evaluation. Debra Walters discussed the assessment and treatment plan with the patient. The patient was provided an opportunity to ask questions and all were answered. The patient agreed with the plan and demonstrated an understanding of the instructions. Labs were ordered at this visit and will be reviewed at the next visit unless more critical results need to be addressed immediately. Clinical information was updated and documented in the EMR.   Time spent on visit including pre-visit chart review and post-visit care was 50 minutes.    Debra Walters have reviewed the above documentation for accuracy and completeness, and Debra Walters agree with the above. - Reuben Likes, MD

## 2021-10-16 ENCOUNTER — Ambulatory Visit (INDEPENDENT_AMBULATORY_CARE_PROVIDER_SITE_OTHER): Payer: No Typology Code available for payment source | Admitting: Family Medicine

## 2021-10-16 ENCOUNTER — Other Ambulatory Visit (HOSPITAL_COMMUNITY): Payer: Self-pay

## 2021-10-16 ENCOUNTER — Encounter (INDEPENDENT_AMBULATORY_CARE_PROVIDER_SITE_OTHER): Payer: Self-pay | Admitting: Family Medicine

## 2021-10-16 VITALS — BP 100/60 | HR 74 | Temp 98.4°F | Ht 67.0 in | Wt >= 6400 oz

## 2021-10-16 DIAGNOSIS — E669 Obesity, unspecified: Secondary | ICD-10-CM

## 2021-10-16 DIAGNOSIS — E559 Vitamin D deficiency, unspecified: Secondary | ICD-10-CM | POA: Diagnosis not present

## 2021-10-16 DIAGNOSIS — Z6841 Body Mass Index (BMI) 40.0 and over, adult: Secondary | ICD-10-CM

## 2021-10-16 DIAGNOSIS — I10 Essential (primary) hypertension: Secondary | ICD-10-CM | POA: Diagnosis not present

## 2021-10-16 DIAGNOSIS — E8881 Metabolic syndrome: Secondary | ICD-10-CM | POA: Diagnosis not present

## 2021-10-16 MED ORDER — VITAMIN D (ERGOCALCIFEROL) 1.25 MG (50000 UNIT) PO CAPS
50000.0000 [IU] | ORAL_CAPSULE | ORAL | 0 refills | Status: DC
  Filled 2021-10-16 – 2021-10-29 (×2): qty 4, 28d supply, fill #0

## 2021-10-18 ENCOUNTER — Ambulatory Visit (INDEPENDENT_AMBULATORY_CARE_PROVIDER_SITE_OTHER): Payer: No Typology Code available for payment source | Admitting: Psychology

## 2021-10-18 DIAGNOSIS — F331 Major depressive disorder, recurrent, moderate: Secondary | ICD-10-CM | POA: Diagnosis not present

## 2021-10-18 DIAGNOSIS — F411 Generalized anxiety disorder: Secondary | ICD-10-CM | POA: Diagnosis not present
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
## 2021-10-23 ENCOUNTER — Inpatient Hospital Stay: Payer: No Typology Code available for payment source | Attending: Hematology and Oncology

## 2021-10-23 ENCOUNTER — Other Ambulatory Visit: Payer: Self-pay | Admitting: Hematology and Oncology

## 2021-10-23 ENCOUNTER — Inpatient Hospital Stay (HOSPITAL_BASED_OUTPATIENT_CLINIC_OR_DEPARTMENT_OTHER): Payer: No Typology Code available for payment source | Admitting: Hematology and Oncology

## 2021-10-23 ENCOUNTER — Other Ambulatory Visit: Payer: Self-pay

## 2021-10-23 VITALS — BP 154/84 | HR 88 | Temp 98.4°F | Resp 17 | Wt >= 6400 oz

## 2021-10-23 DIAGNOSIS — D649 Anemia, unspecified: Secondary | ICD-10-CM | POA: Insufficient documentation

## 2021-10-23 DIAGNOSIS — Z803 Family history of malignant neoplasm of breast: Secondary | ICD-10-CM | POA: Insufficient documentation

## 2021-10-23 DIAGNOSIS — I10 Essential (primary) hypertension: Secondary | ICD-10-CM | POA: Diagnosis not present

## 2021-10-23 DIAGNOSIS — Z808 Family history of malignant neoplasm of other organs or systems: Secondary | ICD-10-CM | POA: Insufficient documentation

## 2021-10-23 DIAGNOSIS — Z7901 Long term (current) use of anticoagulants: Secondary | ICD-10-CM | POA: Insufficient documentation

## 2021-10-23 DIAGNOSIS — Z86711 Personal history of pulmonary embolism: Secondary | ICD-10-CM

## 2021-10-23 LAB — CMP (CANCER CENTER ONLY)
ALT: 21 U/L (ref 0–44)
AST: 16 U/L (ref 15–41)
Albumin: 4 g/dL (ref 3.5–5.0)
Alkaline Phosphatase: 84 U/L (ref 38–126)
Anion gap: 5 (ref 5–15)
BUN: 9 mg/dL (ref 6–20)
CO2: 32 mmol/L (ref 22–32)
Calcium: 9.6 mg/dL (ref 8.9–10.3)
Chloride: 100 mmol/L (ref 98–111)
Creatinine: 0.69 mg/dL (ref 0.44–1.00)
GFR, Estimated: >60 mL/min (ref >60)
Glucose, Bld: 104 mg/dL — ABNORMAL HIGH (ref 70–99)
Potassium: 3.8 mmol/L (ref 3.5–5.1)
Sodium: 137 mmol/L (ref 135–145)
Total Bilirubin: 0.4 mg/dL (ref 0.3–1.2)
Total Protein: 7.5 g/dL (ref 6.5–8.1)

## 2021-10-23 LAB — CBC WITH DIFFERENTIAL (CANCER CENTER ONLY)
Abs Immature Granulocytes: 0.02 10*3/uL (ref 0.00–0.07)
Basophils Absolute: 0 10*3/uL (ref 0.0–0.1)
Basophils Relative: 1 %
Eosinophils Absolute: 0.2 10*3/uL (ref 0.0–0.5)
Eosinophils Relative: 2 %
HCT: 37.5 % (ref 36.0–46.0)
Hemoglobin: 12.6 g/dL (ref 12.0–15.0)
Immature Granulocytes: 0 %
Lymphocytes Relative: 24 %
Lymphs Abs: 2 10*3/uL (ref 0.7–4.0)
MCH: 29.5 pg (ref 26.0–34.0)
MCHC: 33.6 g/dL (ref 30.0–36.0)
MCV: 87.8 fL (ref 80.0–100.0)
Monocytes Absolute: 0.4 10*3/uL (ref 0.1–1.0)
Monocytes Relative: 5 %
Neutro Abs: 5.9 10*3/uL (ref 1.7–7.7)
Neutrophils Relative %: 68 %
Platelet Count: 385 10*3/uL (ref 150–400)
RBC: 4.27 MIL/uL (ref 3.87–5.11)
RDW: 13.7 % (ref 11.5–15.5)
WBC Count: 8.5 10*3/uL (ref 4.0–10.5)
nRBC: 0 % (ref 0.0–0.2)

## 2021-10-23 NOTE — Progress Notes (Signed)
[facility] [phone]   [phone]  PROGRESS NOTE  Patient Care Team: Gerre Scull, NP as PCP - General (Internal Medicine)  Hematological/Oncological History # Unprovoked Bilateral Pulmonary Emboli 06/06/2020: presented with chest pain and shortness of breath to [facility]. CT angio showed acute pulmonary emboli involving all lobes of the lungs. Overall clot burden is moderate to large.  Lower extremity Dopplers negative. Started on Xarelto therapy.  04/22/2021: establish care with Dr. Leonides Schanz   Interval History:  Debra Walters 31 y.o. female with medical history significant for bilateral pulmonary emboli who presents for a follow up visit. The patient's last visit was on 04/22/2021 at which time she established care. In the interim since the last visit she has had no major changes in her health.  On exam today Debra Walters notes that with her IUD her cycles are "hit or miss".  She does that she has not had any bleeding recently.  Is also had no issues with nosebleeds, gum bleeding, or dark stools.  She reports that work has been going okay for her.  She recently did start blood pressure medication with hydrochlorothiazide.  She notes that she is not having any signs or symptoms concerning for recurrent VTE.  She denies any leg swelling, leg pain, shortness of breath, or chest pain.  She currently denies any fevers, chills, sweats, nausea, vomiting or diarrhea.  A full 10 point ROS is listed below.  MEDICAL HISTORY:  Past Medical History:  Diagnosis Date   Asthma    GERD (gastroesophageal reflux disease)    Hypertension    Pulmonary embolism (HCC) 06/01/2020    SURGICAL HISTORY: No past surgical history on file.  SOCIAL HISTORY: Social History   Socioeconomic History   Marital status: Single    Spouse name: Not on file   Number of children: Not on file   Years of education: Not on file   Highest education level: Not on file   Occupational History   Not on file  Tobacco Use   Smoking status: Never   Smokeless tobacco: Never  Vaping Use   Vaping Use: Never used  Substance and Sexual Activity   Alcohol use: Yes    Alcohol/week: 1.0 standard drink of alcohol    Types: 1 drink(s) per week    Comment: monthly   Drug use: No   Sexual activity: Not on file  Other Topics Concern   Not on file  Social History Narrative   Not on file   Social Determinants of Health   Financial Resource Strain: Not on file  Food Insecurity: No Food Insecurity (01/20/2017)   Hunger Vital Sign    Worried About Running Out of Food in the Last Year: Never true    Ran Out of Food in the Last Year: Never true  Transportation Needs: Not on file  Physical Activity: Not on file  Stress: Not on file  Social Connections: Not on file  Intimate Partner Violence: Not on file    FAMILY HISTORY: Family History  Problem Relation Age of Onset   Hyperlipidemia Mother    Diabetes Mother    Hypertension Mother    Drug abuse Mother    Obesity Mother    Drug abuse Father    Heart disease Father    Hypertension Father    Diabetes Maternal Grandmother    Cancer Maternal Grandmother        breast   Hypertension Maternal Grandmother    Hypertension Maternal Grandfather  Hypertension Paternal Grandmother    Obesity Paternal Grandfather    Hypertension Paternal Grandfather    Cancer Maternal Aunt        melanoma    Asthma Other    Hyperlipidemia Other    Hypertension Other    Stroke Other    Heart disease Other    Obesity Other     ALLERGIES:  is allergic to sulfa antibiotics and misc. sulfonamide containing compounds.  MEDICATIONS:  Current Outpatient Medications  Medication Sig Dispense Refill   albuterol (VENTOLIN HFA) 108 (90 Base) MCG/ACT inhaler Inhale 1-2 puffs into the lungs every 6 (six) hours as needed for shortness of breath or wheezing. 18 g 3   cetirizine (ZYRTEC) 10 MG tablet Take by mouth.     famotidine  (PEPCID) 20 MG tablet Take by mouth.     fluticasone-salmeterol (ADVAIR) 100-50 MCG/ACT AEPB Inhale 1 puff into the lungs 2 (two) times daily. 60 each 3   hydrochlorothiazide (HYDRODIURIL) 25 MG tablet Take 1 tablet by mouth daily. 90 tablet 0   levonorgestrel (KYLEENA) 19.5 MG IUD by Intrauterine route.     pantoprazole (PROTONIX) 40 MG tablet Take 1 tablet by mouth daily. 90 tablet 1   rivaroxaban (XARELTO) 20 MG TABS tablet Take 1 tablet (20 mg total) by mouth daily with supper. 30 tablet 5   Semaglutide-Weight Management (WEGOVY) 0.5 MG/0.5ML SOAJ Inject 0.5 mg into the skin once a week. 2 mL 0   Vitamin D, Ergocalciferol, (DRISDOL) 1.25 MG (50000 UNIT) CAPS capsule Take 1 capsule (50,000 Units total) by mouth every 7 (seven) days. 4 capsule 0   No current facility-administered medications for this visit.    REVIEW OF SYSTEMS:   Constitutional: ( - ) fevers, ( - )  chills , ( - ) night sweats Eyes: ( - ) blurriness of vision, ( - ) double vision, ( - ) watery eyes Ears, nose, mouth, throat, and face: ( - ) mucositis, ( - ) sore throat Respiratory: ( - ) cough, ( - ) dyspnea, ( - ) wheezes Cardiovascular: ( - ) palpitation, ( - ) chest discomfort, ( - ) lower extremity swelling Gastrointestinal:  ( - ) nausea, ( - ) heartburn, ( - ) change in bowel habits Skin: ( - ) abnormal skin rashes Lymphatics: ( - ) new lymphadenopathy, ( - ) easy bruising Neurological: ( - ) numbness, ( - ) tingling, ( - ) new weaknesses Behavioral/Psych: ( - ) mood change, ( - ) new changes  All other systems were reviewed with the patient and are negative.  PHYSICAL EXAMINATION:  Vitals:   10/23/21 1201  BP: (!) 154/84  Pulse: 88  Resp: 17  Temp: 98.4 F (36.9 C)  SpO2: 100%   Filed Weights   10/23/21 1201  Weight: (!) 469 lb 4.8 oz (212.9 kg)    GENERAL: Well-appearing obese African-American female, alert, no distress and comfortable SKIN: skin color, texture, turgor are normal, no rashes or  significant lesions EYES: conjunctiva are pink and non-injected, sclera clear LUNGS: clear to auscultation and percussion with normal breathing effort HEART: regular rate & rhythm and no murmurs and no lower extremity edema Musculoskeletal: no cyanosis of digits and no clubbing  PSYCH: alert & oriented x 3, fluent speech NEURO: no focal motor/sensory deficits  LABORATORY DATA:  I have reviewed the data as listed    Latest Ref Rng & Units 10/23/2021   10:54 AM 10/02/2021    9:57 AM 04/22/2021  2:42 PM  CBC  WBC 4.0 - 10.5 K/uL 8.5  7.2  9.0   Hemoglobin 12.0 - 15.0 g/dL 00.9  38.1  82.9   Hematocrit 36.0 - 46.0 % 37.5  42.0  37.6   Platelets 150 - 400 K/uL 385  367  361        Latest Ref Rng & Units 10/23/2021   10:54 AM 10/02/2021    9:57 AM 04/22/2021    2:42 PM  CMP  Glucose 70 - 99 mg/dL 937  82  92   BUN 6 - 20 mg/dL 9  11  12    Creatinine 0.44 - 1.00 mg/dL  1.69  6.78   Sodium 135 - 145 mmol/L 137  139  139   Potassium 3.5 - 5.1 mmol/L 3.8  4.0  3.8   Chloride 98 - 111 mmol/L 100  98  105   CO2 22 - 32 mmol/L 32  25  27   Calcium 8.9 - 10.3 mg/dL 9.6  9.7  9.1   Total Protein 6.5 - 8.1 g/dL 7.5  7.7  7.8   Total Bilirubin 0.3 - 1.2 mg/dL 0.4  0.4  0.5   Alkaline Phos 38 - 126 U/L 84  112  87   AST 15 - 41 U/L 16  22  11    ALT 0 - 44 U/L 21  30  13      RADIOGRAPHIC STUDIES: No results found.  ASSESSMENT & PLAN Debra Walters 31 y.o. female with medical history significant for bilateral pulmonary emboli who presents for a follow up visit.   # Unprovoked Bilateral Pulmonary Emboli  -- At this time findings are most consistent with bilateral pulmonary emboli that were unprovoked.  The patient does have morbid obesity which may have contributed, though this does not appear to be a modifiable risk factor --Full hypercoagulable studies performed on 06/06/2020 at [facility] showed no clear abnormalities.  Testing include Antithrombin III, protein C, protein S, and  lupus anticoagulant testing.  Additionally factor V Leiden and prothrombin gene were tested for and no clear abnormalities were found. Plan:  --Recommend indefinite anticoagulation with Xarelto therapy.  She is currently taking 20 mg p.o. daily.  Given her obesity would not recommend decreasing down to maintenance dosing. --Labs today show evidence of count 8.5, hemoglobin 12.6, MCV 87.8, and platelets 385.  Creatinine is 0.69 with normal LFTs.  Okay to proceed with DOAC therapy. --Return to clinic in 6 months time to reevaluate   #Mild Anemia -- Hgb normalized to 12.6 today.   No orders of the defined types were placed in this encounter.   All questions were answered. The patient knows to call the clinic with any problems, questions or concerns.  A total of more than 30 minutes were spent on this encounter with face-to-face time and non-face-to-face time, including preparing to see the patient, ordering tests and/or medications, counseling the patient and coordination of care as outlined above.   38, MD Department of Hematology/Oncology [facility] Phone: [phone] Pager: [phone] Email: [email or web address]  10/27/2021 1:47 PM

## 2021-10-24 NOTE — Progress Notes (Signed)
Chief Complaint:   OBESITY Debra Walters is here to discuss her progress with her obesity treatment plan along with follow-up of her obesity related diagnoses. Debra Walters is on the Category 4 Plan and states she is following her eating plan approximately 50% of the time. Debra Walters states she is exercising 0 minutes 0 times per week.  Today's visit was #: 2 Starting weight: 463 lbs Starting date: 10/02/2021 Today's weight: 466 lbs Today's date: 10/16/2021 Total lbs lost to date: 0 lbs Total lbs lost since last in-office visit: 0  Interim History: Debra Walters had just gone grocery shopping prior to last appointment so she was eating the food she had. She has plans to go grocery shopping again today. Next few weeks she has a birthday party for her sister, date type event in the next few weeks. Her biggest obstacle in next few weeks is losing motivation to stay on plan.  Subjective:   1. Essential hypertension Azzie's blood pressure borderline low today. On HCTZ. Occasional and rare dizziness/lightheadedness but not consistent.  2. Vitamin D deficiency Labs discussed during visit today. Debra Walters is currently taking prescription Vit D 50,000 IU once a week. She notes fatigue.  3. Insulin resistance Labs discussed during visit today. Debra Walters's A1c 5.1, Insulin 30.4. She is not currently on medications.  Assessment/Plan:   1. Essential hypertension We'll follow up on blood pressure at next appointment: if symptoms increase will reach out to PCP about medication management.  2. Vitamin D deficiency Start Vit D 50k IU once weekly for 1 month with 0 refills.  -Start Vitamin D, Ergocalciferol, (DRISDOL) 1.25 MG (50000 UNIT) CAPS capsule; Take 1 capsule (50,000 Units total) by mouth every 7 (seven) days.  Dispense: 4 capsule; Refill: 0  3. Insulin resistance Pathophysiology of IR, Prediabetes+ diabetes discussed today. Discussed initiate of medication pending visits and reported cravings/hunger and weight  loss.  4. Obesity with current BMI of 73.1 Debra Walters is currently in the action stage of change. As such, her goal is to continue with weight loss efforts. She has agreed to the Category 4 Plan.   Exercise goals: No exercise has been prescribed at this time.  Behavioral modification strategies: increasing lean protein intake, meal planning and cooking strategies, keeping healthy foods in the home, and planning for success.  Debra Walters has agreed to follow-up with our clinic in 2 weeks. She was informed of the importance of frequent follow-up visits to maximize her success with intensive lifestyle modifications for her multiple health conditions.   Objective:   Blood pressure 100/60, pulse 74, temperature 98.4 F (36.9 C), height 5\' 7"  (1.702 m), weight (!) 466 lb (211.4 kg), last menstrual period 09/18/2021, SpO2 96 %. Body mass index is 72.99 kg/m.  General: Cooperative, alert, well developed, in no acute distress. HEENT: Conjunctivae and lids unremarkable. Cardiovascular: Regular rhythm.  Lungs: Normal work of breathing. Neurologic: No focal deficits.   Lab Results  Component Value Date   CREATININE 0.69 10/23/2021   BUN 9 10/23/2021   NA 137 10/23/2021   K 3.8 10/23/2021   CL 100 10/23/2021   CO2 32 10/23/2021   Lab Results  Component Value Date   ALT 21 10/23/2021   AST 16 10/23/2021   ALKPHOS 84 10/23/2021   BILITOT 0.4 10/23/2021   Lab Results  Component Value Date   HGBA1C 5.1 10/02/2021   HGBA1C 5.2 04/01/2021   Lab Results  Component Value Date   INSULIN 30.4 (H) 10/02/2021   Lab Results  Component  Value Date   TSH 1.330 10/02/2021   Lab Results  Component Value Date   CHOL 150 10/02/2021   HDL 46 10/02/2021   LDLCALC 87 10/02/2021   TRIG 91 10/02/2021   Lab Results  Component Value Date   VD25OH 19.6 (L) 10/02/2021   Lab Results  Component Value Date   WBC 8.5 10/23/2021   HGB 12.6 10/23/2021   HCT 37.5 10/23/2021   MCV 87.8 10/23/2021   PLT  385 10/23/2021   Lab Results  Component Value Date   IRON 28 04/22/2021   TIBC 346 04/22/2021   FERRITIN 53 04/22/2021   Attestation Statements:   Reviewed by clinician on day of visit: allergies, medications, problem list, medical history, surgical history, family history, social history, and previous encounter notes.  Time spent on visit including pre-visit chart review and post-visit care and charting was 42 minutes.   I, Fortino Sic, RMA am acting as transcriptionist for Reuben Likes, MD.  I have reviewed the above documentation for accuracy and completeness, and I agree with the above. - Reuben Likes, MD

## 2021-10-25 ENCOUNTER — Ambulatory Visit (INDEPENDENT_AMBULATORY_CARE_PROVIDER_SITE_OTHER): Payer: No Typology Code available for payment source | Admitting: Psychology

## 2021-10-25 ENCOUNTER — Other Ambulatory Visit (HOSPITAL_COMMUNITY): Payer: Self-pay

## 2021-10-25 DIAGNOSIS — F411 Generalized anxiety disorder: Secondary | ICD-10-CM

## 2021-10-25 DIAGNOSIS — F331 Major depressive disorder, recurrent, moderate: Secondary | ICD-10-CM
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
## 2021-10-29 ENCOUNTER — Ambulatory Visit: Payer: No Typology Code available for payment source | Admitting: Psychology

## 2021-10-29 ENCOUNTER — Other Ambulatory Visit (HOSPITAL_COMMUNITY): Payer: Self-pay

## 2021-10-31 ENCOUNTER — Ambulatory Visit (INDEPENDENT_AMBULATORY_CARE_PROVIDER_SITE_OTHER): Payer: No Typology Code available for payment source | Admitting: Family Medicine

## 2021-10-31 ENCOUNTER — Other Ambulatory Visit: Payer: Self-pay | Admitting: Nurse Practitioner

## 2021-10-31 ENCOUNTER — Other Ambulatory Visit (HOSPITAL_COMMUNITY): Payer: Self-pay

## 2021-11-01 ENCOUNTER — Other Ambulatory Visit: Payer: Self-pay | Admitting: Nurse Practitioner

## 2021-11-01 ENCOUNTER — Other Ambulatory Visit (HOSPITAL_COMMUNITY): Payer: Self-pay

## 2021-11-01 MED ORDER — WEGOVY 1 MG/0.5ML ~~LOC~~ SOAJ
1.0000 mg | SUBCUTANEOUS | 0 refills | Status: DC
  Filled 2021-11-01: qty 2, 28d supply, fill #0

## 2021-11-05 ENCOUNTER — Ambulatory Visit: Payer: No Typology Code available for payment source | Admitting: Psychology

## 2021-11-05 ENCOUNTER — Encounter (INDEPENDENT_AMBULATORY_CARE_PROVIDER_SITE_OTHER): Payer: Self-pay | Admitting: Family Medicine

## 2021-11-05 ENCOUNTER — Ambulatory Visit (INDEPENDENT_AMBULATORY_CARE_PROVIDER_SITE_OTHER): Payer: No Typology Code available for payment source | Admitting: Family Medicine

## 2021-11-05 VITALS — BP 109/58 | HR 100 | Temp 98.4°F | Ht 67.0 in | Wt >= 6400 oz

## 2021-11-05 DIAGNOSIS — E669 Obesity, unspecified: Secondary | ICD-10-CM | POA: Diagnosis not present

## 2021-11-05 DIAGNOSIS — I10 Essential (primary) hypertension: Secondary | ICD-10-CM | POA: Diagnosis not present

## 2021-11-05 DIAGNOSIS — E559 Vitamin D deficiency, unspecified: Secondary | ICD-10-CM | POA: Insufficient documentation

## 2021-11-05 DIAGNOSIS — Z6841 Body Mass Index (BMI) 40.0 and over, adult: Secondary | ICD-10-CM | POA: Diagnosis not present

## 2021-11-07 ENCOUNTER — Other Ambulatory Visit (HOSPITAL_COMMUNITY): Payer: Self-pay

## 2021-11-13 NOTE — Progress Notes (Signed)
Chief Complaint:   OBESITY Debra Walters is here to discuss her progress with her obesity treatment plan along with follow-up of her obesity related diagnoses. Debra Walters is on the Category 4 Plan and states she is following her eating plan approximately 25% of the time. Debra Walters states she is doing 0 minutes 0 times per week.  Today's visit was #: 3 Starting weight: 463 lbs Starting date: 10/02/2021 Today's weight: 462 lbs Today's date: 11/05/2021 Total lbs lost to date: 1 Total lbs lost since last in-office visit: 4  Interim History: Debra Walters is working on portion control and she has done well with weight loss.  She finds her hunger is better controlled on Wegovy.  She notes some initial GERD and increased belching, but this is improving.  Subjective:   1. Essential hypertension Debra Walters's blood pressure was elevated at her last visit, and is now on the low end of normal.  She is working on her diet and weight loss.  2. Vitamin D deficiency Debra Walters is on vitamin D, but her level is not yet at goal.  She notes fatigue.  Assessment/Plan:   1. Essential hypertension Debra Walters will continue with her diet, exercise, and weight loss and she will watch for signs of hypotension.  2. Vitamin D deficiency Debra Walters will continue prescription vitamin D, no refill needed and we will recheck labs in 2 months.  3. Obesity, Current BMI 72.5 Debra Walters is currently in the action stage of change. As such, her goal is to continue with weight loss efforts. She has agreed to keeping a food journal and adhering to recommended goals of 1600-2000 calories and 100+ grams of protein daily.   We discussed various medication options to help Debra Walters with her weight loss efforts and we both agreed to continue [facility].  Behavioral modification strategies: increasing lean protein intake and meal planning and cooking strategies.  Debra Walters has agreed to follow-up with our clinic in 2 to 3 weeks. She was informed of the importance of  frequent follow-up visits to maximize her success with intensive lifestyle modifications for her multiple health conditions.   Objective:   Blood pressure (!) 109/58, pulse 100, temperature 98.4 F (36.9 C), height 5\' 7"  (1.702 m), weight (!) 462 lb (209.6 kg), SpO2 95 %. Body mass index is 72.36 kg/m.  General: Cooperative, alert, well developed, in no acute distress. HEENT: Conjunctivae and lids unremarkable. Cardiovascular: Regular rhythm.  Lungs: Normal work of breathing. Neurologic: No focal deficits.   Lab Results  Component Value Date   CREATININE 0.69 10/23/2021   BUN 9 10/23/2021   NA 137 10/23/2021   K 3.8 10/23/2021   CL 100 10/23/2021   CO2 32 10/23/2021   Lab Results  Component Value Date   ALT 21 10/23/2021   AST 16 10/23/2021   ALKPHOS 84 10/23/2021   BILITOT 0.4 10/23/2021   Lab Results  Component Value Date   HGBA1C 5.1 10/02/2021   HGBA1C 5.2 04/01/2021   Lab Results  Component Value Date   INSULIN 30.4 (H) 10/02/2021   Lab Results  Component Value Date   TSH 1.330 10/02/2021   Lab Results  Component Value Date   CHOL 150 10/02/2021   HDL 46 10/02/2021   LDLCALC 87 10/02/2021   TRIG 91 10/02/2021   Lab Results  Component Value Date   VD25OH 19.6 (L) 10/02/2021   Lab Results  Component Value Date   WBC 8.5 10/23/2021   HGB 12.6 10/23/2021   HCT 37.5 10/23/2021  MCV 87.8 10/23/2021   PLT 385 10/23/2021   Lab Results  Component Value Date   IRON 28 04/22/2021   TIBC 346 04/22/2021   FERRITIN 53 04/22/2021   Attestation Statements:   Reviewed by clinician on day of visit: allergies, medications, problem list, medical history, surgical history, family history, social history, and previous encounter notes.   I, Burt Knack, am acting as transcriptionist for Quillian Quince, MD.  I have reviewed the above documentation for accuracy and completeness, and I agree with the above. -  Quillian Quince, MD

## 2021-11-14 ENCOUNTER — Ambulatory Visit (INDEPENDENT_AMBULATORY_CARE_PROVIDER_SITE_OTHER): Payer: No Typology Code available for payment source | Admitting: Psychology

## 2021-11-14 DIAGNOSIS — F411 Generalized anxiety disorder: Secondary | ICD-10-CM | POA: Diagnosis not present

## 2021-11-14 DIAGNOSIS — F331 Major depressive disorder, recurrent, moderate: Secondary | ICD-10-CM | POA: Diagnosis not present
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
## 2021-11-22 ENCOUNTER — Ambulatory Visit (INDEPENDENT_AMBULATORY_CARE_PROVIDER_SITE_OTHER): Payer: No Typology Code available for payment source | Admitting: Psychology

## 2021-11-22 DIAGNOSIS — F411 Generalized anxiety disorder: Secondary | ICD-10-CM

## 2021-11-22 DIAGNOSIS — F331 Major depressive disorder, recurrent, moderate: Secondary | ICD-10-CM | POA: Diagnosis not present
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
## 2021-12-02 ENCOUNTER — Ambulatory Visit (INDEPENDENT_AMBULATORY_CARE_PROVIDER_SITE_OTHER): Payer: No Typology Code available for payment source | Admitting: Family Medicine

## 2021-12-05 ENCOUNTER — Encounter: Payer: Self-pay | Admitting: Nurse Practitioner

## 2021-12-05 ENCOUNTER — Ambulatory Visit (INDEPENDENT_AMBULATORY_CARE_PROVIDER_SITE_OTHER): Payer: No Typology Code available for payment source | Admitting: Nurse Practitioner

## 2021-12-05 ENCOUNTER — Other Ambulatory Visit (HOSPITAL_COMMUNITY): Payer: Self-pay

## 2021-12-05 VITALS — BP 127/85 | HR 99 | Temp 98.2°F | Ht 67.0 in | Wt >= 6400 oz

## 2021-12-05 DIAGNOSIS — F320 Major depressive disorder, single episode, mild: Secondary | ICD-10-CM

## 2021-12-05 DIAGNOSIS — F419 Anxiety disorder, unspecified: Secondary | ICD-10-CM

## 2021-12-05 MED ORDER — HYDROXYZINE HCL 10 MG PO TABS
10.0000 mg | ORAL_TABLET | Freq: Three times a day (TID) | ORAL | 0 refills | Status: DC | PRN
  Filled 2021-12-05: qty 30, 10d supply, fill #0

## 2021-12-05 MED ORDER — SERTRALINE HCL 25 MG PO TABS
25.0000 mg | ORAL_TABLET | Freq: Every day | ORAL | 1 refills | Status: DC
  Filled 2021-12-05: qty 30, 30d supply, fill #0

## 2021-12-05 NOTE — Progress Notes (Signed)
Established Patient Office Visit  Subjective   Patient ID: Debra Walters, female    DOB: 1990-07-11  Age: 31 y.o. MRN: [id number]  Chief Complaint  Patient presents with   Acute Visit    Pt wants to discuss medication for anxiety     HPI  Francoise Rossman is here to discuss depression and anxiety.  She states that she has been feeling overwhelmed recently and little things will get her angry.  She has been very tearful.  She has had multiple panic attacks that have increased in frequency.  She states that her normal coping mechanisms are not working.  She is talking with a therapist routinely.  She is not taking any medications in the past, however believes she may need this now.  She denies SI/HI.     12/05/2021    4:51 PM 12/05/2021    4:06 PM 10/02/2021    8:36 AM 07/19/2021    8:54 AM 04/01/2021    4:37 PM  Depression screen PHQ 2/9  Decreased Interest 3 3 3 1 1   Down, Depressed, Hopeless 3 3 3 1 1   PHQ - 2 Score 6 6 6 2 2   Altered sleeping 3  1 1 1   Tired, decreased energy 3  2 1 1   Change in appetite 3  3 1 3   Feeling bad or failure about yourself  3  3 3 1   Trouble concentrating 3  3 2 1   Moving slowly or fidgety/restless 3  0 1 1  Suicidal thoughts 0  0 0 0  PHQ-9 Score 24  18 11 10   Difficult doing work/chores Very difficult  Somewhat difficult Somewhat difficult Somewhat difficult      12/05/2021    4:51 PM 07/19/2021    8:54 AM 04/01/2021    4:37 PM  GAD 7 : Generalized Anxiety Score  Nervous, Anxious, on Edge 3 1 1   Control/stop worrying 3 1 1   Worry too much - different things 3 1 1   Trouble relaxing 3 0 0  Restless 3 1 1   Easily annoyed or irritable 3 1 1   Afraid - awful might happen 3 0 0  Total GAD 7 Score 21 5 5   Anxiety Difficulty Very difficult Somewhat difficult Somewhat difficult      ROS See pertinent positives and negatives per HPI.    Objective:     BP 127/85 (BP Location: Left Wrist, Patient Position: Sitting, Cuff Size: Large)    Pulse 99   Temp 98.2 F (36.8 C) (Temporal)   Ht 5\' 7"  (1.702 m)   Wt (!) 467 lb 9.6 oz (212.1 kg)   SpO2 97%   BMI 73.24 kg/m  BP Readings from Last 3 Encounters:  12/05/21 127/85  11/05/21 (!) 109/58  10/23/21 (!) 154/84   Wt Readings from Last 3 Encounters:  12/05/21 (!) 467 lb 9.6 oz (212.1 kg)  11/05/21 (!) 462 lb (209.6 kg)  10/23/21 (!) 469 lb 4.8 oz (212.9 kg)      Physical Exam Vitals and nursing note reviewed.  Constitutional:      General: She is not in acute distress.    Appearance: Normal appearance.  HENT:     Head: Normocephalic.  Eyes:     Conjunctiva/sclera: Conjunctivae normal.  Pulmonary:     Effort: Pulmonary effort is normal.  Musculoskeletal:     Cervical back: Normal range of motion.  Skin:    General: Skin is warm.  Neurological:     General: No focal  deficit present.     Mental Status: She is alert and oriented to person, place, and time.  Psychiatric:        Mood and Affect: Mood normal.        Behavior: Behavior normal.        Thought Content: Thought content normal.        Judgment: Judgment normal.      Assessment & Plan:   Problem List Items Addressed This Visit       Other   Depression, major, single episode, mild ([place]) - Primary    Chronic, not controlled.  Her symptoms is worsened recently with little stressors adding up in her personal life.  She states that her normal coping mechanisms are not helping.  Her PHQ-9 is a 24.  She denies SI/HI.  She is currently following with a therapist.  We will start her on sertraline 25 mg daily.  Discussed possible side effects.  Follow-up in 4 weeks.      Relevant Medications   sertraline (ZOLOFT) 25 MG tablet   hydrOXYzine (ATARAX) 10 MG tablet   Anxiety    Chronic, not controlled.  She has been having multiple panic attacks frequently.  Her GAD-7 is a 21.  She is talking with a therapist currently, however is interested in medications.  We will start her on sertraline 25 mg daily with  hydroxyzine 10 mg as needed for panic attack.  Discussed that this may make her sleepy.  She denies SI/HI.  Follow-up in 4 weeks.      Relevant Medications   sertraline (ZOLOFT) 25 MG tablet   hydrOXYzine (ATARAX) 10 MG tablet    Return in about 4 weeks (around 01/02/2022) for Depression, Anxiety can be virtual .    Charyl Dancer, NP

## 2021-12-05 NOTE — Assessment & Plan Note (Signed)
Chronic, not controlled.  Her symptoms is worsened recently with little stressors adding up in her personal life.  She states that her normal coping mechanisms are not helping.  Her PHQ-9 is a 24.  She denies SI/HI.  She is currently following with a therapist.  We will start her on sertraline 25 mg daily.  Discussed possible side effects.  Follow-up in 4 weeks.

## 2021-12-05 NOTE — Assessment & Plan Note (Signed)
Chronic, not controlled.  She has been having multiple panic attacks frequently.  Her GAD-7 is a 21.  She is talking with a therapist currently, however is interested in medications.  We will start her on sertraline 25 mg daily with hydroxyzine 10 mg as needed for panic attack.  Discussed that this may make her sleepy.  She denies SI/HI.  Follow-up in 4 weeks.

## 2021-12-05 NOTE — Patient Instructions (Signed)
It was great to see you!  Start sertraline (zoloft) 1 tablet daily and hydroxyzine 1 tablet as needed for panic attacks. This may make you sleepy.   Let's follow-up in 4-6 weeks, sooner if you have concerns.  If a referral was placed today, you will be contacted for an appointment. Please note that routine referrals can sometimes take up to 3-4 weeks to process. Please call our office if you haven't heard anything after this time frame.  Take care,  Vance Peper, NP

## 2021-12-09 ENCOUNTER — Other Ambulatory Visit: Payer: Self-pay | Admitting: Nurse Practitioner

## 2021-12-09 ENCOUNTER — Other Ambulatory Visit (INDEPENDENT_AMBULATORY_CARE_PROVIDER_SITE_OTHER): Payer: Self-pay | Admitting: Family Medicine

## 2021-12-09 ENCOUNTER — Other Ambulatory Visit (HOSPITAL_COMMUNITY): Payer: Self-pay

## 2021-12-09 DIAGNOSIS — E559 Vitamin D deficiency, unspecified: Secondary | ICD-10-CM

## 2021-12-11 ENCOUNTER — Other Ambulatory Visit (HOSPITAL_COMMUNITY): Payer: Self-pay

## 2021-12-11 MED ORDER — RIVAROXABAN 20 MG PO TABS
20.0000 mg | ORAL_TABLET | Freq: Every day | ORAL | 5 refills | Status: DC
  Filled 2021-12-11 – 2021-12-24 (×2): qty 30, 30d supply, fill #0
  Filled 2022-02-22: qty 30, 30d supply, fill #1
  Filled 2022-03-24 – 2022-04-18 (×2): qty 30, 30d supply, fill #2
  Filled 2022-06-16: qty 30, 30d supply, fill #3
  Filled 2022-07-10 – 2022-07-22 (×2): qty 30, 30d supply, fill #4
  Filled 2022-08-28 – 2022-09-23 (×2): qty 30, 30d supply, fill #5

## 2021-12-20 ENCOUNTER — Ambulatory Visit: Payer: No Typology Code available for payment source | Admitting: Psychology

## 2021-12-21 ENCOUNTER — Other Ambulatory Visit (HOSPITAL_COMMUNITY): Payer: Self-pay

## 2021-12-23 ENCOUNTER — Ambulatory Visit (INDEPENDENT_AMBULATORY_CARE_PROVIDER_SITE_OTHER): Payer: No Typology Code available for payment source | Admitting: Family Medicine

## 2021-12-24 ENCOUNTER — Other Ambulatory Visit: Payer: Self-pay | Admitting: Nurse Practitioner

## 2021-12-24 ENCOUNTER — Other Ambulatory Visit (HOSPITAL_COMMUNITY): Payer: Self-pay

## 2021-12-24 ENCOUNTER — Other Ambulatory Visit (INDEPENDENT_AMBULATORY_CARE_PROVIDER_SITE_OTHER): Payer: Self-pay | Admitting: Family Medicine

## 2021-12-24 DIAGNOSIS — E559 Vitamin D deficiency, unspecified: Secondary | ICD-10-CM

## 2021-12-24 MED ORDER — HYDROCHLOROTHIAZIDE 25 MG PO TABS
25.0000 mg | ORAL_TABLET | Freq: Every day | ORAL | 0 refills | Status: DC
  Filled 2021-12-24: qty 90, 90d supply, fill #0

## 2021-12-24 MED ORDER — WEGOVY 1 MG/0.5ML ~~LOC~~ SOAJ
1.0000 mg | SUBCUTANEOUS | 0 refills | Status: DC
  Filled 2021-12-24: qty 2, 28d supply, fill #0

## 2021-12-25 ENCOUNTER — Other Ambulatory Visit (HOSPITAL_COMMUNITY): Payer: Self-pay

## 2021-12-27 ENCOUNTER — Other Ambulatory Visit (HOSPITAL_COMMUNITY): Payer: Self-pay

## 2021-12-31 ENCOUNTER — Other Ambulatory Visit (HOSPITAL_COMMUNITY): Payer: Self-pay

## 2022-01-03 ENCOUNTER — Ambulatory Visit (INDEPENDENT_AMBULATORY_CARE_PROVIDER_SITE_OTHER): Payer: No Typology Code available for payment source | Admitting: Psychology

## 2022-01-03 DIAGNOSIS — F331 Major depressive disorder, recurrent, moderate: Secondary | ICD-10-CM

## 2022-01-03 DIAGNOSIS — F411 Generalized anxiety disorder: Secondary | ICD-10-CM | POA: Diagnosis not present

## 2022-01-03 NOTE — Progress Notes (Unsigned)
   Established Patient Office Visit  Subjective   Patient ID: Debra Walters, female    DOB: Jul 06, 1990  Age: 31 y.o. MRN: [id number]  No chief complaint on file.   HPI  Kristilyn Tetrault is here to follow-up on depression and anxiety. Last visit she was started on sertraline 25mg  daily and hydroxyzine prn panic attacks. She has also started therapy.      12/05/2021    4:51 PM 12/05/2021    4:06 PM 10/02/2021    8:36 AM 07/19/2021    8:54 AM 04/01/2021    4:37 PM  Depression screen PHQ 2/9  Decreased Interest 3 3 3 1 1   Down, Depressed, Hopeless 3 3 3 1 1   PHQ - 2 Score 6 6 6 2 2   Altered sleeping 3  1 1 1   Tired, decreased energy 3  2 1 1   Change in appetite 3  3 1 3   Feeling bad or failure about yourself  3  3 3 1   Trouble concentrating 3  3 2 1   Moving slowly or fidgety/restless 3  0 1 1  Suicidal thoughts 0  0 0 0  PHQ-9 Score 24  18 11 10   Difficult doing work/chores Very difficult  Somewhat difficult Somewhat difficult Somewhat difficult      12/05/2021    4:51 PM 07/19/2021    8:54 AM 04/01/2021    4:37 PM  GAD 7 : Generalized Anxiety Score  Nervous, Anxious, on Edge 3 1 1   Control/stop worrying 3 1 1   Worry too much - different things 3 1 1   Trouble relaxing 3 0 0  Restless 3 1 1   Easily annoyed or irritable 3 1 1   Afraid - awful might happen 3 0 0  Total GAD 7 Score 21 5 5   Anxiety Difficulty Very difficult Somewhat difficult Somewhat difficult    {History (Optional):23778}  ROS    Objective:     There were no vitals taken for this visit. {Vitals History (Optional):23777}  Physical Exam   No results found for any visits on 01/06/22.  {Labs (Optional):23779}  The ASCVD Risk score (Arnett DK, et al., 2019) failed to calculate for the following reasons:   The 2019 ASCVD risk score is only valid for ages 19 to 74    Assessment & Plan:   Problem List Items Addressed This Visit   None   No follow-ups on file.    Charyl Dancer, NP
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
## 2022-01-06 ENCOUNTER — Ambulatory Visit (INDEPENDENT_AMBULATORY_CARE_PROVIDER_SITE_OTHER): Payer: No Typology Code available for payment source | Admitting: Nurse Practitioner

## 2022-01-06 ENCOUNTER — Encounter: Payer: Self-pay | Admitting: Nurse Practitioner

## 2022-01-06 ENCOUNTER — Other Ambulatory Visit (HOSPITAL_COMMUNITY): Payer: Self-pay

## 2022-01-06 VITALS — BP 120/80 | HR 96 | Temp 98.1°F | Wt >= 6400 oz

## 2022-01-06 DIAGNOSIS — F419 Anxiety disorder, unspecified: Secondary | ICD-10-CM

## 2022-01-06 DIAGNOSIS — F320 Major depressive disorder, single episode, mild: Secondary | ICD-10-CM | POA: Diagnosis not present

## 2022-01-06 MED ORDER — WEGOVY 1 MG/0.5ML ~~LOC~~ SOAJ
1.0000 mg | SUBCUTANEOUS | 3 refills | Status: DC
  Filled 2022-01-06: qty 2, 28d supply, fill #0

## 2022-01-06 MED ORDER — SERTRALINE HCL 25 MG PO TABS
25.0000 mg | ORAL_TABLET | Freq: Every day | ORAL | 1 refills | Status: DC
  Filled 2022-01-06: qty 90, 90d supply, fill #0
  Filled 2022-03-24 – 2022-04-18 (×2): qty 90, 90d supply, fill #1

## 2022-01-06 NOTE — Patient Instructions (Signed)
It was great to see you!  Keep taking the zoloft 1 tablet daily.   Let's follow-up in 3 months, sooner if you have concerns.  If a referral was placed today, you will be contacted for an appointment. Please note that routine referrals can sometimes take up to 3-4 weeks to process. Please call our office if you haven't heard anything after this time frame.  Take care,  Vance Peper, NP

## 2022-01-06 NOTE — Assessment & Plan Note (Signed)
Chronic, Improving.  She states that the sertraline has really helped her symptoms.  Her GAD-7 has gone from 21 to an 8.  We will have her continue the sertraline 25 mg daily and talking with a therapist.  Follow-up in 6 months.

## 2022-01-06 NOTE — Assessment & Plan Note (Signed)
Chronic, improving.  She states that the sertraline 25 mg have really helped with her symptoms and is helping her deal with life stressors.  Her PHQ-9 has improved from a 24 to a 12.  She denies SI/HI.  We will have her continue the sertraline 25 mg daily.  Follow-up in 6 months.

## 2022-01-08 ENCOUNTER — Other Ambulatory Visit (HOSPITAL_COMMUNITY): Payer: Self-pay

## 2022-01-15 ENCOUNTER — Other Ambulatory Visit (INDEPENDENT_AMBULATORY_CARE_PROVIDER_SITE_OTHER): Payer: Self-pay | Admitting: Family Medicine

## 2022-01-15 ENCOUNTER — Other Ambulatory Visit (HOSPITAL_COMMUNITY): Payer: Self-pay

## 2022-01-15 DIAGNOSIS — E559 Vitamin D deficiency, unspecified: Secondary | ICD-10-CM

## 2022-01-17 ENCOUNTER — Ambulatory Visit (INDEPENDENT_AMBULATORY_CARE_PROVIDER_SITE_OTHER): Payer: No Typology Code available for payment source | Admitting: Psychology

## 2022-01-17 DIAGNOSIS — F331 Major depressive disorder, recurrent, moderate: Secondary | ICD-10-CM

## 2022-01-17 DIAGNOSIS — F411 Generalized anxiety disorder: Secondary | ICD-10-CM
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
## 2022-01-24 ENCOUNTER — Other Ambulatory Visit (HOSPITAL_COMMUNITY): Payer: Self-pay

## 2022-01-29 ENCOUNTER — Other Ambulatory Visit (HOSPITAL_COMMUNITY): Payer: Self-pay

## 2022-01-31 ENCOUNTER — Ambulatory Visit (INDEPENDENT_AMBULATORY_CARE_PROVIDER_SITE_OTHER): Payer: No Typology Code available for payment source | Admitting: Psychology

## 2022-01-31 DIAGNOSIS — F331 Major depressive disorder, recurrent, moderate: Secondary | ICD-10-CM

## 2022-01-31 DIAGNOSIS — F411 Generalized anxiety disorder: Secondary | ICD-10-CM
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
## 2022-02-05 ENCOUNTER — Other Ambulatory Visit (HOSPITAL_COMMUNITY): Payer: Self-pay

## 2022-02-14 ENCOUNTER — Ambulatory Visit (INDEPENDENT_AMBULATORY_CARE_PROVIDER_SITE_OTHER): Payer: No Typology Code available for payment source | Admitting: Psychology

## 2022-02-14 DIAGNOSIS — F331 Major depressive disorder, recurrent, moderate: Secondary | ICD-10-CM

## 2022-02-14 DIAGNOSIS — F411 Generalized anxiety disorder: Secondary | ICD-10-CM
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
## 2022-02-22 ENCOUNTER — Other Ambulatory Visit (INDEPENDENT_AMBULATORY_CARE_PROVIDER_SITE_OTHER): Payer: Self-pay | Admitting: Family Medicine

## 2022-02-22 ENCOUNTER — Other Ambulatory Visit: Payer: Self-pay

## 2022-02-22 DIAGNOSIS — E559 Vitamin D deficiency, unspecified: Secondary | ICD-10-CM

## 2022-02-25 ENCOUNTER — Other Ambulatory Visit (HOSPITAL_COMMUNITY): Payer: Self-pay

## 2022-02-25 MED ORDER — ALBUTEROL SULFATE HFA 108 (90 BASE) MCG/ACT IN AERS
1.0000 | INHALATION_SPRAY | Freq: Four times a day (QID) | RESPIRATORY_TRACT | 2 refills | Status: DC | PRN
  Filled 2022-02-25: qty 6.7, 17d supply, fill #0
  Filled 2022-03-24 – 2022-04-18 (×2): qty 6.7, 17d supply, fill #1

## 2022-02-28 ENCOUNTER — Ambulatory Visit: Payer: No Typology Code available for payment source | Admitting: Psychology

## 2022-03-14 ENCOUNTER — Ambulatory Visit: Payer: 59 | Admitting: Psychology

## 2022-03-24 ENCOUNTER — Other Ambulatory Visit (HOSPITAL_COMMUNITY): Payer: Self-pay

## 2022-03-24 ENCOUNTER — Other Ambulatory Visit: Payer: Self-pay

## 2022-03-24 ENCOUNTER — Other Ambulatory Visit: Payer: Self-pay | Admitting: Nurse Practitioner

## 2022-03-24 MED ORDER — PANTOPRAZOLE SODIUM 40 MG PO TBEC
40.0000 mg | DELAYED_RELEASE_TABLET | Freq: Every day | ORAL | 1 refills | Status: DC
  Filled 2022-03-24 – 2022-04-18 (×2): qty 90, 90d supply, fill #0
  Filled 2022-08-28 – 2022-09-23 (×2): qty 90, 90d supply, fill #1

## 2022-03-24 MED ORDER — HYDROCHLOROTHIAZIDE 25 MG PO TABS
25.0000 mg | ORAL_TABLET | Freq: Every day | ORAL | 0 refills | Status: DC
  Filled 2022-03-24 – 2022-04-18 (×2): qty 90, 90d supply, fill #0

## 2022-03-24 MED ORDER — FLUTICASONE-SALMETEROL 100-50 MCG/ACT IN AEPB
1.0000 | INHALATION_SPRAY | Freq: Two times a day (BID) | RESPIRATORY_TRACT | 3 refills | Status: AC
  Filled 2022-03-24 – 2022-04-18 (×2): qty 60, 30d supply, fill #0
  Filled 2022-07-10 – 2022-09-23 (×2): qty 60, 30d supply, fill #1
  Filled 2022-11-17: qty 60, 30d supply, fill #2

## 2022-03-24 NOTE — Telephone Encounter (Signed)
Chart supports Rx Last OV: 01/2022 Next OV: 04/2022  

## 2022-03-25 ENCOUNTER — Other Ambulatory Visit (HOSPITAL_COMMUNITY): Payer: Self-pay

## 2022-03-28 ENCOUNTER — Ambulatory Visit: Payer: 59 | Admitting: Psychology

## 2022-03-28 ENCOUNTER — Ambulatory Visit: Payer: No Typology Code available for payment source | Admitting: Psychology

## 2022-04-03 ENCOUNTER — Other Ambulatory Visit (HOSPITAL_COMMUNITY): Payer: Self-pay

## 2022-04-08 NOTE — Progress Notes (Deleted)
   Established Patient Office Visit  Subjective   Patient ID: Debra Walters, female    DOB: [birth year]  Age: 32 y.o. MRN: [id number]  No chief complaint on file.   HPI  Debra Walters is here to follow-up on weight management. She is currently taking Wegovy 1mg  injection weekly.   {History (Optional):23778}  ROS    Objective:     There were no vitals taken for this visit. BP Readings from Last 3 Encounters:  01/06/22 120/80  12/05/21 127/85  11/05/21 (!) 109/58   Wt Readings from Last 3 Encounters:  01/06/22 (!) 458 lb 9.6 oz (208 kg)  12/05/21 (!) 467 lb 9.6 oz (212.1 kg)  11/05/21 (!) 462 lb (209.6 kg)      Physical Exam   No results found for any visits on 04/09/22.  {Labs (Optional):23779}  The ASCVD Risk score (Arnett DK, et al., 2019) failed to calculate for the following reasons:   The 2019 ASCVD risk score is only valid for ages 75 to 62    Assessment & Plan:   Problem List Items Addressed This Visit   None   No follow-ups on file.    Charyl Dancer, NP

## 2022-04-09 ENCOUNTER — Telehealth: Payer: Self-pay | Admitting: Nurse Practitioner

## 2022-04-09 ENCOUNTER — Ambulatory Visit: Payer: 59 | Admitting: Nurse Practitioner

## 2022-04-09 NOTE — Telephone Encounter (Signed)
2.7.24 no show letter sent 

## 2022-04-10 ENCOUNTER — Other Ambulatory Visit (HOSPITAL_COMMUNITY): Payer: Self-pay

## 2022-04-11 ENCOUNTER — Ambulatory Visit: Payer: No Typology Code available for payment source | Admitting: Psychology

## 2022-04-14 NOTE — Telephone Encounter (Signed)
Noted  

## 2022-04-14 NOTE — Telephone Encounter (Signed)
1st no show, fee waived, letter sent to reschedule/notify of policy  Pt has rescheduled for 04/25/2022

## 2022-04-18 ENCOUNTER — Other Ambulatory Visit (HOSPITAL_COMMUNITY): Payer: Self-pay

## 2022-04-22 ENCOUNTER — Other Ambulatory Visit (HOSPITAL_COMMUNITY): Payer: Self-pay

## 2022-04-25 ENCOUNTER — Inpatient Hospital Stay (HOSPITAL_BASED_OUTPATIENT_CLINIC_OR_DEPARTMENT_OTHER): Payer: 59 | Admitting: Hematology and Oncology

## 2022-04-25 ENCOUNTER — Inpatient Hospital Stay: Payer: 59 | Attending: Hematology and Oncology

## 2022-04-25 ENCOUNTER — Other Ambulatory Visit (HOSPITAL_COMMUNITY): Payer: Self-pay

## 2022-04-25 ENCOUNTER — Other Ambulatory Visit: Payer: Self-pay

## 2022-04-25 ENCOUNTER — Other Ambulatory Visit: Payer: Self-pay | Admitting: Hematology and Oncology

## 2022-04-25 ENCOUNTER — Ambulatory Visit (INDEPENDENT_AMBULATORY_CARE_PROVIDER_SITE_OTHER): Payer: 59 | Admitting: Nurse Practitioner

## 2022-04-25 ENCOUNTER — Encounter: Payer: Self-pay | Admitting: Nurse Practitioner

## 2022-04-25 VITALS — BP 132/88 | HR 96 | Temp 97.9°F | Ht 67.0 in | Wt >= 6400 oz

## 2022-04-25 VITALS — BP 132/79 | HR 75 | Temp 97.6°F | Resp 17 | Wt >= 6400 oz

## 2022-04-25 DIAGNOSIS — Z79899 Other long term (current) drug therapy: Secondary | ICD-10-CM | POA: Insufficient documentation

## 2022-04-25 DIAGNOSIS — F320 Major depressive disorder, single episode, mild: Secondary | ICD-10-CM | POA: Diagnosis not present

## 2022-04-25 DIAGNOSIS — Z86711 Personal history of pulmonary embolism: Secondary | ICD-10-CM

## 2022-04-25 DIAGNOSIS — Z803 Family history of malignant neoplasm of breast: Secondary | ICD-10-CM | POA: Diagnosis not present

## 2022-04-25 DIAGNOSIS — I10 Essential (primary) hypertension: Secondary | ICD-10-CM | POA: Insufficient documentation

## 2022-04-25 DIAGNOSIS — Z808 Family history of malignant neoplasm of other organs or systems: Secondary | ICD-10-CM | POA: Diagnosis not present

## 2022-04-25 DIAGNOSIS — Z7901 Long term (current) use of anticoagulants: Secondary | ICD-10-CM | POA: Insufficient documentation

## 2022-04-25 LAB — CMP (CANCER CENTER ONLY)
ALT: 22 U/L (ref 0–44)
AST: 15 U/L (ref 15–41)
Albumin: 3.7 g/dL (ref 3.5–5.0)
Alkaline Phosphatase: 84 U/L (ref 38–126)
Anion gap: 4 — ABNORMAL LOW (ref 5–15)
BUN: 13 mg/dL (ref 6–20)
CO2: 31 mmol/L (ref 22–32)
Calcium: 8.8 mg/dL — ABNORMAL LOW (ref 8.9–10.3)
Chloride: 105 mmol/L (ref 98–111)
Creatinine: 0.73 mg/dL (ref 0.44–1.00)
GFR, Estimated: >60 mL/min (ref >60)
Glucose, Bld: 97 mg/dL (ref 70–99)
Potassium: 4.1 mmol/L (ref 3.5–5.1)
Sodium: 140 mmol/L (ref 135–145)
Total Bilirubin: 0.4 mg/dL (ref 0.3–1.2)
Total Protein: 7.3 g/dL (ref 6.5–8.1)

## 2022-04-25 LAB — CBC WITH DIFFERENTIAL (CANCER CENTER ONLY)
Abs Immature Granulocytes: 0.02 10*3/uL (ref 0.00–0.07)
Basophils Absolute: 0.1 10*3/uL (ref 0.0–0.1)
Basophils Relative: 1 %
Eosinophils Absolute: 0.3 10*3/uL (ref 0.0–0.5)
Eosinophils Relative: 3 %
HCT: 39.8 % (ref 36.0–46.0)
Hemoglobin: 13.1 g/dL (ref 12.0–15.0)
Immature Granulocytes: 0 %
Lymphocytes Relative: 24 %
Lymphs Abs: 2.2 10*3/uL (ref 0.7–4.0)
MCH: 29.4 pg (ref 26.0–34.0)
MCHC: 32.9 g/dL (ref 30.0–36.0)
MCV: 89.2 fL (ref 80.0–100.0)
Monocytes Absolute: 0.4 10*3/uL (ref 0.1–1.0)
Monocytes Relative: 4 %
Neutro Abs: 6.1 10*3/uL (ref 1.7–7.7)
Neutrophils Relative %: 68 %
Platelet Count: 400 10*3/uL (ref 150–400)
RBC: 4.46 MIL/uL (ref 3.87–5.11)
RDW: 13.9 % (ref 11.5–15.5)
WBC Count: 9.1 10*3/uL (ref 4.0–10.5)
nRBC: 0 % (ref 0.0–0.2)

## 2022-04-25 MED ORDER — ONDANSETRON HCL 4 MG PO TABS
4.0000 mg | ORAL_TABLET | Freq: Three times a day (TID) | ORAL | 0 refills | Status: DC | PRN
  Filled 2022-04-25 – 2022-07-22 (×3): qty 20, 7d supply, fill #0

## 2022-04-25 MED ORDER — ZEPBOUND 2.5 MG/0.5ML ~~LOC~~ SOAJ
2.5000 mg | SUBCUTANEOUS | 1 refills | Status: DC
  Filled 2022-04-25 – 2022-06-12 (×2): qty 2, 28d supply, fill #0
  Filled 2022-07-10 – 2022-07-22 (×2): qty 2, 28d supply, fill #1

## 2022-04-25 NOTE — Assessment & Plan Note (Signed)
She followed up with oncology today and states that everything is well.  She is continuing to take her Xarelto 20 mg daily.

## 2022-04-25 NOTE — Patient Instructions (Signed)
It was great to see you!  Start zepbound injection once a week to help with weight loss.   Keep up the great work with your nutrition.  Let's follow-up in 3 months, sooner if you have concerns.  If a referral was placed today, you will be contacted for an appointment. Please note that routine referrals can sometimes take up to 3-4 weeks to process. Please call our office if you haven't heard anything after this time frame.  Take care,  Vance Peper, NP

## 2022-04-25 NOTE — Assessment & Plan Note (Signed)
BMI 72.2.  She has stopped the [facility] a few months ago due to nausea.  She is interested in trying a different medication.  Will have her start Zepbound 2.5 mg injections once a week.  Will also give her some Zofran 4 mg 3 times a day as needed for nausea.  Discussed possible side effects.  She has thought about getting weight loss surgery, however would like to hold off on this if able.  She has not gained weight back that she had lost while being on the [facility].  She is continuing to eat regularly and measure out portions.  She had also tried phentermine, Contrave, and Topamax in the past.

## 2022-04-25 NOTE — Progress Notes (Signed)
[place] [phone]   [phone]  PROGRESS NOTE  Patient Care Team: Charyl Dancer, NP as PCP - General (Internal Medicine)  Hematological/Oncological History # Unprovoked Bilateral Pulmonary Emboli 06/06/2020: presented with chest pain and shortness of breath to [facility]. CT angio showed acute pulmonary emboli involving all lobes of the lungs. Overall clot burden is moderate to large.  Lower extremity Dopplers negative. Started on Xarelto therapy.  04/22/2021: establish care with Dr. Lorenso Courier   Interval History:  Debra Walters 32 y.o. female with medical history significant for bilateral pulmonary emboli who presents for a follow up visit. The patient's last visit was on 10/23/2021 at which time she established care. In the interim since the last visit she has had no major changes in her health.  On exam today Debra Walters reports she has had no major changes in her health in the interim since her last visit.  She notes that she is taking Zoloft and tolerating it "very well".  She notes that she has had no other changes in her health.  She continues to take her Xarelto 20 mg p.o. daily and is taking it faithfully.  She is not having any trouble with bleeding, bruising, or dark stools.  She reports that the cost of the medication is "okay" and is pang about $10 per month with the manufacturer program.  She notes that she is not having any signs or symptoms concerning for recurrent blood clot such as leg pain, leg swelling, chest pain, or shortness of breath.  She reports that her menstrual cycles have not been affected as that she does not typically have menstrual cycles with her IUD.  She is not having any bleeding elsewhere.  Overall she is willing and able to continue on Xarelto therapy at this time.  She currently denies any fevers, chills, sweats, nausea, vomiting or diarrhea.  A full 10 point ROS is listed below.  MEDICAL HISTORY:  Past Medical  History:  Diagnosis Date   Asthma    GERD (gastroesophageal reflux disease)    Hypertension    Pulmonary embolism ([place]) 06/01/2020    SURGICAL HISTORY: No past surgical history on file.  SOCIAL HISTORY: Social History   Socioeconomic History   Marital status: Single    Spouse name: Not on file   Number of children: Not on file   Years of education: Not on file   Highest education level: Not on file  Occupational History   Not on file  Tobacco Use   Smoking status: Never   Smokeless tobacco: Never  Vaping Use   Vaping Use: Never used  Substance and Sexual Activity   Alcohol use: Yes    Alcohol/week: 1.0 standard drink of alcohol    Types: 1 drink(s) per week    Comment: monthly   Drug use: No   Sexual activity: Not on file  Other Topics Concern   Not on file  Social History Narrative   Not on file   Social Determinants of Health   Financial Resource Strain: Not on file  Food Insecurity: No Food Insecurity (01/20/2017)   Hunger Vital Sign    Worried About Running Out of Food in the Last Year: Never true    Ran Out of Food in the Last Year: Never true  Transportation Needs: Not on file  Physical Activity: Not on file  Stress: Not on file  Social Connections: Not on file  Intimate Partner Violence: Not on file  FAMILY HISTORY: Family History  Problem Relation Age of Onset   Hyperlipidemia Mother    Diabetes Mother    Hypertension Mother    Drug abuse Mother    Obesity Mother    Drug abuse Father    Heart disease Father    Hypertension Father    Diabetes Maternal Grandmother    Cancer Maternal Grandmother        breast   Hypertension Maternal Grandmother    Hypertension Maternal Grandfather    Hypertension Paternal Grandmother    Obesity Paternal Grandfather    Hypertension Paternal Grandfather    Cancer Maternal Aunt        melanoma    Asthma Other    Hyperlipidemia Other    Hypertension Other    Stroke Other    Heart disease Other     Obesity Other     ALLERGIES:  is allergic to sulfa antibiotics and misc. sulfonamide containing compounds.  MEDICATIONS:  Current Outpatient Medications  Medication Sig Dispense Refill   albuterol (VENTOLIN HFA) 108 (90 Base) MCG/ACT inhaler Inhale 1 - 2 puffs into the lungs every 6 hours as needed for shortness of breath or wheezing. 18 g 3   cetirizine (ZYRTEC) 10 MG tablet Take by mouth.     famotidine (PEPCID) 20 MG tablet Take by mouth.     fluticasone-salmeterol (ADVAIR DISKUS) 100-50 MCG/ACT AEPB Inhale 1 puff into the lungs 2 times daily. 60 each 3   hydrochlorothiazide (HYDRODIURIL) 25 MG tablet Take 1 tablet (25 mg total) by mouth daily. 90 tablet 0   hydrOXYzine (ATARAX) 10 MG tablet Take 1 tablet (10 mg total) by mouth 3 (three) times daily as needed. 30 tablet 0   levonorgestrel (KYLEENA) 19.5 MG IUD by Intrauterine route.     ondansetron (ZOFRAN) 4 MG tablet Take 1 tablet (4 mg total) by mouth every 8 (eight) hours as needed for nausea or vomiting. 20 tablet 0   pantoprazole (PROTONIX) 40 MG tablet Take 1 tablet (40 mg total) by mouth daily. 90 tablet 1   rivaroxaban (XARELTO) 20 MG TABS tablet Take 1 tablet (20 mg total) by mouth daily with supper. 30 tablet 5   sertraline (ZOLOFT) 25 MG tablet Take 1 tablet (25 mg total) by mouth daily. 90 tablet 1   tirzepatide (ZEPBOUND) 2.5 MG/0.5ML Pen Inject 2.5 mg into the skin once a week. 2 mL 1   No current facility-administered medications for this visit.    REVIEW OF SYSTEMS:   Constitutional: ( - ) fevers, ( - )  chills , ( - ) night sweats Eyes: ( - ) blurriness of vision, ( - ) double vision, ( - ) watery eyes Ears, nose, mouth, throat, and face: ( - ) mucositis, ( - ) sore throat Respiratory: ( - ) cough, ( - ) dyspnea, ( - ) wheezes Cardiovascular: ( - ) palpitation, ( - ) chest discomfort, ( - ) lower extremity swelling Gastrointestinal:  ( - ) nausea, ( - ) heartburn, ( - ) change in bowel habits Skin: ( - ) abnormal  skin rashes Lymphatics: ( - ) new lymphadenopathy, ( - ) easy bruising Neurological: ( - ) numbness, ( - ) tingling, ( - ) new weaknesses Behavioral/Psych: ( - ) mood change, ( - ) new changes  All other systems were reviewed with the patient and are negative.  PHYSICAL EXAMINATION:  Vitals:   04/25/22 0923  BP: 132/79  Pulse: 75  Resp: 17  Temp: 97.6  F (36.4 C)  SpO2: 99%    Filed Weights   04/25/22 0923  Weight: (!) 462 lb 9.6 oz (209.8 kg)     GENERAL: Well-appearing obese African-American female, alert, no distress and comfortable SKIN: skin color, texture, turgor are normal, no rashes or significant lesions EYES: conjunctiva are pink and non-injected, sclera clear LUNGS: clear to auscultation and percussion with normal breathing effort HEART: regular rate & rhythm and no murmurs and no lower extremity edema Musculoskeletal: no cyanosis of digits and no clubbing  PSYCH: alert & oriented x 3, fluent speech NEURO: no focal motor/sensory deficits  LABORATORY DATA:  I have reviewed the data as listed    Latest Ref Rng & Units 04/25/2022    8:52 AM 10/23/2021   10:54 AM 10/02/2021    9:57 AM  CBC  WBC 4.0 - 10.5 K/uL 9.1  8.5  7.2   Hemoglobin 12.0 - 15.0 g/dL 13.1  12.6  13.6   Hematocrit 36.0 - 46.0 % 39.8  37.5  42.0   Platelets 150 - 400 K/uL 400  385  367        Latest Ref Rng & Units 04/25/2022    8:52 AM 10/23/2021   10:54 AM 10/02/2021    9:57 AM  CMP  Glucose 70 - 99 mg/dL 97  104  82   BUN 6 - 20 mg/dL '13  9  11   '$ Creatinine 0.44 - 1.00 mg/dL 0.73  0.69  0.85   Sodium 135 - 145 mmol/L 140  137  139   Potassium 3.5 - 5.1 mmol/L 4.1  3.8  4.0   Chloride 98 - 111 mmol/L 105  100  98   CO2 22 - 32 mmol/L 31  32  25   Calcium 8.9 - 10.3 mg/dL 8.8  9.6  9.7   Total Protein 6.5 - 8.1 g/dL 7.3  7.5  7.7   Total Bilirubin 0.3 - 1.2 mg/dL 0.4  0.4  0.4   Alkaline Phos 38 - 126 U/L 84  84  112   AST 15 - 41 U/L '15  16  22   '$ ALT 0 - 44 U/L '22  21  30      '$ RADIOGRAPHIC STUDIES: No results found.  ASSESSMENT & PLAN Debra Walters 32 y.o. female with medical history significant for bilateral pulmonary emboli who presents for a follow up visit.   # Unprovoked Bilateral Pulmonary Emboli  -- At this time findings are most consistent with bilateral pulmonary emboli that were unprovoked.  The patient does have morbid obesity which may have contributed, though this does not appear to be a modifiable risk factor --Full hypercoagulable studies performed on 06/06/2020 at [place] showed no clear abnormalities.  Testing include Antithrombin III, protein C, protein S, and lupus anticoagulant testing.  Additionally factor V Leiden and prothrombin gene were tested for and no clear abnormalities were found. Plan:  --Recommend indefinite anticoagulation with Xarelto therapy.  She is currently taking 20 mg p.o. daily.  Given her obesity would not recommend decreasing down to maintenance dosing. --Labs today show white blood cell count 9.1, hemoglobin 13.1, MCV 89.2, and platelets of 400. creatinine is 0.73 with normal LFTs.  Okay to continue with DOAC therapy. --Return to clinic in 6 months time to reevaluate   #Mild Anemia--resolved.  -- Hgb normalized to 13.1 today.   No orders of the defined types were placed in this encounter.   All questions were answered. The patient knows to  call the clinic with any problems, questions or concerns.  A total of more than 25 minutes were spent on this encounter with face-to-face time and non-face-to-face time, including preparing to see the patient, ordering tests and/or medications, counseling the patient and coordination of care as outlined above.   Ledell Peoples, MD Department of Hematology/Oncology [place] [facility] Phone: [phone] Pager: [phone] Email: [email or web address]  04/27/2022 3:43 PM

## 2022-04-25 NOTE — Progress Notes (Signed)
Established Patient Office Visit  Subjective   Patient ID: Debra Walters, female    DOB: [birth year]  Age: 32 y.o. MRN: [id number]  Chief Complaint  Patient presents with   Medical Management of Chronic Issues    No concerns    HPI  Debra Walters is here to follow-up on depression and weight management.  She still taking the sertraline 25 mg daily and states that this is doing well for the most part.  She is also following with a therapist.  She denies SI/HI.  She states that she stopped the [facility] as she was having significant nausea that was interfering with her everyday life.  She has not gained any weight back from when she stopped this a few months ago.  She would like to talk about other medication options today.  She also has a thought of having bariatric surgery, however is hoping to hold off on this.     04/25/2022    4:27 PM 01/06/2022    4:57 PM 12/05/2021    4:51 PM 12/05/2021    4:06 PM 10/02/2021    8:36 AM  Depression screen PHQ 2/9  Decreased Interest '1 1 3 3 3  '$ Down, Depressed, Hopeless '1 1 3 3 3  '$ PHQ - 2 Score '2 2 6 6 6  '$ Altered sleeping '1 1 3  1  '$ Tired, decreased energy '1 1 3  2  '$ Change in appetite '1 1 3  3  '$ Feeling bad or failure about yourself  '1 3 3  3  '$ Trouble concentrating '1 3 3  3  '$ Moving slowly or fidgety/restless '1 1 3  '$ 0  Suicidal thoughts 1 0 0  0  PHQ-9 Score '9 12 24  18  '$ Difficult doing work/chores Somewhat difficult Somewhat difficult Very difficult  Somewhat difficult      04/25/2022    4:28 PM 01/06/2022    4:57 PM 12/05/2021    4:51 PM 07/19/2021    8:54 AM  GAD 7 : Generalized Anxiety Score  Nervous, Anxious, on Edge '1 1 3 1  '$ Control/stop worrying '1 1 3 1  '$ Worry too much - different things '1 1 3 1  '$ Trouble relaxing '1 1 3 '$ 0  Restless '1 1 3 1  '$ Easily annoyed or irritable '1 2 3 1  '$ Afraid - awful might happen 0 1 3 0  Total GAD 7 Score '6 8 21 5  '$ Anxiety Difficulty Somewhat difficult Somewhat difficult Very difficult Somewhat  difficult      ROS    Objective:     BP 132/88 (BP Location: Right Arm)   Pulse 96   Temp 97.9 F (36.6 C)   Ht '5\' 7"'$  (1.702 m)   Wt (!) 461 lb 6.4 oz (209.3 kg)   SpO2 97%   BMI 72.27 kg/m  BP Readings from Last 3 Encounters:  04/25/22 132/88  04/25/22 132/79  01/06/22 120/80   Wt Readings from Last 3 Encounters:  04/25/22 (!) 461 lb 6.4 oz (209.3 kg)  04/25/22 (!) 462 lb 9.6 oz (209.8 kg)  01/06/22 (!) 458 lb 9.6 oz (208 kg)    Physical Exam Vitals and nursing note reviewed.  Constitutional:      General: She is not in acute distress.    Appearance: Normal appearance. She is obese.  HENT:     Head: Normocephalic.  Eyes:     Conjunctiva/sclera: Conjunctivae normal.  Cardiovascular:     Rate and Rhythm: Normal rate and regular rhythm.  Pulses: Normal pulses.     Heart sounds: Normal heart sounds.  Pulmonary:     Effort: Pulmonary effort is normal.     Breath sounds: Normal breath sounds.  Musculoskeletal:     Cervical back: Normal range of motion.  Skin:    General: Skin is warm.  Neurological:     General: No focal deficit present.     Mental Status: She is alert and oriented to person, place, and time.  Psychiatric:        Mood and Affect: Mood normal.        Behavior: Behavior normal.        Thought Content: Thought content normal.        Judgment: Judgment normal.      Results for orders placed or performed in visit on 04/25/22  CMP ([place] only)  Result Value Ref Range   Sodium 140 135 - 145 mmol/L   Potassium 4.1 3.5 - 5.1 mmol/L   Chloride 105 98 - 111 mmol/L   CO2 31 22 - 32 mmol/L   Glucose, Bld 97 70 - 99 mg/dL   BUN 13 6 - 20 mg/dL   Creatinine 0.73 0.44 - 1.00 mg/dL   Calcium 8.8 (L) 8.9 - 10.3 mg/dL   Total Protein 7.3 6.5 - 8.1 g/dL   Albumin 3.7 3.5 - 5.0 g/dL   AST 15 15 - 41 U/L   ALT 22 0 - 44 U/L   Alkaline Phosphatase 84 38 - 126 U/L   Total Bilirubin 0.4 0.3 - 1.2 mg/dL   GFR, Estimated >60 >60 mL/min    Anion gap 4 (L) 5 - 15  CBC with Differential ([facility] Center Only)  Result Value Ref Range   WBC Count 9.1 4.0 - 10.5 K/uL   RBC 4.46 3.87 - 5.11 MIL/uL   Hemoglobin 13.1 12.0 - 15.0 g/dL   HCT 39.8 36.0 - 46.0 %   MCV 89.2 80.0 - 100.0 fL   MCH 29.4 26.0 - 34.0 pg   MCHC 32.9 30.0 - 36.0 g/dL   RDW 13.9 11.5 - 15.5 %   Platelet Count 400 150 - 400 K/uL   nRBC 0.0 0.0 - 0.2 %   Neutrophils Relative % 68 %   Neutro Abs 6.1 1.7 - 7.7 K/uL   Lymphocytes Relative 24 %   Lymphs Abs 2.2 0.7 - 4.0 K/uL   Monocytes Relative 4 %   Monocytes Absolute 0.4 0.1 - 1.0 K/uL   Eosinophils Relative 3 %   Eosinophils Absolute 0.3 0.0 - 0.5 K/uL   Basophils Relative 1 %   Basophils Absolute 0.1 0.0 - 0.1 K/uL   Immature Granulocytes 0 %   Abs Immature Granulocytes 0.02 0.00 - 0.07 K/uL      Assessment & Plan:   Problem List Items Addressed This Visit       Other   Morbid obesity (HCC)    BMI 72.2.  She has stopped the [facility] a few months ago due to nausea.  She is interested in trying a different medication.  Will have her start Zepbound 2.5 mg injections once a week.  Will also give her some Zofran 4 mg 3 times a day as needed for nausea.  Discussed possible side effects.  She has thought about getting weight loss surgery, however would like to hold off on this if able.  She has not gained weight back that she had lost while being on the [facility].  She is continuing to  eat regularly and measure out portions.  She had also tried phentermine, Contrave, and Topamax in the past.      Relevant Medications   tirzepatide (ZEPBOUND) 2.5 MG/0.5ML Pen   Depression, major, single episode, mild (HCC) - Primary    Chronic, stable.  Continue sertraline 25 mg daily and following with a therapist.  Follow-up in 3 months or sooner with concerns.      History of pulmonary embolism    She followed up with oncology today and states that everything is well.  She is continuing to take her Xarelto 20 mg daily.        Return in about 3 months (around 07/24/2022) for CPE.    Charyl Dancer, NP

## 2022-04-25 NOTE — Assessment & Plan Note (Signed)
Chronic, stable.  Continue sertraline 25 mg daily and following with a therapist.  Follow-up in 3 months or sooner with concerns.

## 2022-04-30 ENCOUNTER — Encounter: Payer: Self-pay | Admitting: Nurse Practitioner

## 2022-05-02 ENCOUNTER — Other Ambulatory Visit (HOSPITAL_COMMUNITY): Payer: Self-pay

## 2022-05-04 ENCOUNTER — Other Ambulatory Visit (HOSPITAL_COMMUNITY): Payer: Self-pay

## 2022-05-05 ENCOUNTER — Other Ambulatory Visit (HOSPITAL_COMMUNITY): Payer: Self-pay

## 2022-05-09 ENCOUNTER — Ambulatory Visit (INDEPENDENT_AMBULATORY_CARE_PROVIDER_SITE_OTHER): Payer: 59 | Admitting: Psychology

## 2022-05-09 DIAGNOSIS — F411 Generalized anxiety disorder: Secondary | ICD-10-CM | POA: Diagnosis not present

## 2022-05-09 DIAGNOSIS — F331 Major depressive disorder, recurrent, moderate: Secondary | ICD-10-CM | POA: Diagnosis not present
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
## 2022-05-12 ENCOUNTER — Other Ambulatory Visit (HOSPITAL_COMMUNITY): Payer: Self-pay

## 2022-05-23 ENCOUNTER — Other Ambulatory Visit (HOSPITAL_COMMUNITY): Payer: Self-pay

## 2022-05-23 ENCOUNTER — Ambulatory Visit: Payer: No Typology Code available for payment source | Admitting: Psychology

## 2022-06-06 ENCOUNTER — Other Ambulatory Visit (HOSPITAL_COMMUNITY): Payer: Self-pay

## 2022-06-06 ENCOUNTER — Ambulatory Visit (INDEPENDENT_AMBULATORY_CARE_PROVIDER_SITE_OTHER): Payer: 59 | Admitting: Psychology

## 2022-06-06 ENCOUNTER — Telehealth: Payer: Self-pay

## 2022-06-06 DIAGNOSIS — F411 Generalized anxiety disorder: Secondary | ICD-10-CM

## 2022-06-06 DIAGNOSIS — F331 Major depressive disorder, recurrent, moderate: Secondary | ICD-10-CM | POA: Diagnosis not present
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
## 2022-06-06 NOTE — Telephone Encounter (Signed)
Pharmacy Patient Advocate Encounter   Received notification that prior authorization for Zepbound 2.5mg /0.56ml is required/requested.  Per Test Claim: prior authorization required   PA submitted on 06/06/22 to (ins) [employer] via CoverMyMeds Key # [id number] Status is pending

## 2022-06-11 NOTE — Telephone Encounter (Signed)
Patient Advocate Encounter  Prior Authorization for Zepbound 2.5mg /0.65ml has been approved.    PA# PA Case ID #: [id number] Effective dates: 06/09/22 through 12/06/22

## 2022-06-11 NOTE — Telephone Encounter (Signed)
LVM to return call.

## 2022-06-12 ENCOUNTER — Other Ambulatory Visit (HOSPITAL_COMMUNITY): Payer: Self-pay

## 2022-06-12 NOTE — Telephone Encounter (Signed)
LVM to return call.

## 2022-06-12 NOTE — Telephone Encounter (Signed)
Patient notified of below message and will call back if there is any issues.

## 2022-06-16 ENCOUNTER — Other Ambulatory Visit: Payer: Self-pay

## 2022-06-16 ENCOUNTER — Other Ambulatory Visit (HOSPITAL_COMMUNITY): Payer: Self-pay

## 2022-06-16 ENCOUNTER — Other Ambulatory Visit: Payer: Self-pay | Admitting: Nurse Practitioner

## 2022-06-19 ENCOUNTER — Other Ambulatory Visit (HOSPITAL_COMMUNITY): Payer: Self-pay

## 2022-06-19 MED ORDER — HYDROXYZINE HCL 10 MG PO TABS
10.0000 mg | ORAL_TABLET | Freq: Three times a day (TID) | ORAL | 1 refills | Status: AC | PRN
  Filled 2022-06-19: qty 30, 10d supply, fill #0
  Filled 2023-05-27: qty 30, 10d supply, fill #1

## 2022-06-20 ENCOUNTER — Ambulatory Visit: Payer: No Typology Code available for payment source | Admitting: Psychology

## 2022-07-04 ENCOUNTER — Ambulatory Visit (INDEPENDENT_AMBULATORY_CARE_PROVIDER_SITE_OTHER): Payer: 59 | Admitting: Psychology

## 2022-07-04 DIAGNOSIS — F411 Generalized anxiety disorder: Secondary | ICD-10-CM | POA: Diagnosis not present

## 2022-07-04 DIAGNOSIS — F331 Major depressive disorder, recurrent, moderate: Secondary | ICD-10-CM
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
## 2022-07-10 ENCOUNTER — Other Ambulatory Visit (HOSPITAL_COMMUNITY): Payer: Self-pay

## 2022-07-10 ENCOUNTER — Other Ambulatory Visit: Payer: Self-pay | Admitting: Nurse Practitioner

## 2022-07-10 ENCOUNTER — Other Ambulatory Visit: Payer: Self-pay

## 2022-07-10 MED ORDER — SERTRALINE HCL 25 MG PO TABS
25.0000 mg | ORAL_TABLET | Freq: Every day | ORAL | 0 refills | Status: DC
  Filled 2022-07-10 – 2022-09-23 (×3): qty 90, 90d supply, fill #0

## 2022-07-10 MED ORDER — HYDROCHLOROTHIAZIDE 25 MG PO TABS
25.0000 mg | ORAL_TABLET | Freq: Every day | ORAL | 0 refills | Status: DC
  Filled 2022-07-10 – 2022-09-23 (×3): qty 90, 90d supply, fill #0

## 2022-07-10 NOTE — Telephone Encounter (Signed)
Requesting: sertraline (ZOLOFT) 25 MG tablet , hydrochlorothiazide (HYDRODIURIL) 25 MG tablet  Last Visit: 04/25/2022 Next Visit: 07/24/2022 Last Refill: 01/06/2022, 03/24/2022  Please Advise

## 2022-07-18 ENCOUNTER — Ambulatory Visit (INDEPENDENT_AMBULATORY_CARE_PROVIDER_SITE_OTHER): Payer: 59 | Admitting: Psychology

## 2022-07-18 DIAGNOSIS — F331 Major depressive disorder, recurrent, moderate: Secondary | ICD-10-CM

## 2022-07-18 DIAGNOSIS — F411 Generalized anxiety disorder: Secondary | ICD-10-CM
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
## 2022-07-19 ENCOUNTER — Other Ambulatory Visit (HOSPITAL_COMMUNITY): Payer: Self-pay

## 2022-07-22 ENCOUNTER — Other Ambulatory Visit (HOSPITAL_COMMUNITY): Payer: Self-pay

## 2022-07-23 ENCOUNTER — Other Ambulatory Visit (HOSPITAL_COMMUNITY): Payer: Self-pay

## 2022-07-24 ENCOUNTER — Encounter: Payer: Self-pay | Admitting: Nurse Practitioner

## 2022-07-24 ENCOUNTER — Other Ambulatory Visit (HOSPITAL_COMMUNITY): Payer: Self-pay

## 2022-07-24 ENCOUNTER — Ambulatory Visit (INDEPENDENT_AMBULATORY_CARE_PROVIDER_SITE_OTHER): Payer: 59 | Admitting: Nurse Practitioner

## 2022-07-24 VITALS — BP 134/88 | HR 82 | Temp 97.7°F | Ht 67.0 in | Wt >= 6400 oz

## 2022-07-24 DIAGNOSIS — Z86711 Personal history of pulmonary embolism: Secondary | ICD-10-CM

## 2022-07-24 DIAGNOSIS — Z0000 Encounter for general adult medical examination without abnormal findings: Secondary | ICD-10-CM | POA: Insufficient documentation

## 2022-07-24 DIAGNOSIS — I10 Essential (primary) hypertension: Secondary | ICD-10-CM | POA: Diagnosis not present

## 2022-07-24 DIAGNOSIS — J4530 Mild persistent asthma, uncomplicated: Secondary | ICD-10-CM

## 2022-07-24 DIAGNOSIS — F320 Major depressive disorder, single episode, mild: Secondary | ICD-10-CM

## 2022-07-24 MED ORDER — TRIAMCINOLONE ACETONIDE 0.1 % EX CREA
1.0000 | TOPICAL_CREAM | Freq: Two times a day (BID) | CUTANEOUS | 0 refills | Status: AC
  Filled 2022-07-24: qty 30, 15d supply, fill #0

## 2022-07-24 NOTE — Assessment & Plan Note (Signed)
She follows with hematology and states that everything is well.  She is continuing to take her Xarelto 20 mg daily.

## 2022-07-24 NOTE — Patient Instructions (Signed)
It was great to see you!  Start triamcinolone cream twice a day to your hand. I usually recommend doing this for 2 weeks, then stopping for 2 weeks  You can also use aquaphor or eucerin cream to help moisturize.   You can look into weight watchers or track your food.   Let's follow-up in 6 months, sooner if you have concerns.  If a referral was placed today, you will be contacted for an appointment. Please note that routine referrals can sometimes take up to 3-4 weeks to process. Please call our office if you haven't heard anything after this time frame.  Take care,  Rodman Pickle, NP

## 2022-07-24 NOTE — Assessment & Plan Note (Signed)
Chronic, stable. Will continue HCTZ 25mg  daily. Last CMP reviewed. Follow-up in 6 months.

## 2022-07-24 NOTE — Assessment & Plan Note (Signed)
Chronic, stable.  Continue sertraline 25 mg daily and following with a therapist.  Follow-up in 6 months or sooner with concerns.

## 2022-07-24 NOTE — Assessment & Plan Note (Signed)
Health maintenance reviewed and updated. Discussed nutrition, exercise.  Recent CMP, CBC reviewed.  Follow-up 1 year.  

## 2022-07-24 NOTE — Assessment & Plan Note (Signed)
Chronic, stable.  Symptoms are well controlled on Advair and albuterol as needed.  Up to date on pneumonia vaccine.

## 2022-07-24 NOTE — Progress Notes (Signed)
BP 134/88 (BP Location: Right Wrist)   Pulse 82   Temp 97.7 F (36.5 C)   Ht 5\' 7"  (1.702 m)   Wt (!) 463 lb (210 kg)   LMP 07/21/2022   SpO2 94%   BMI 72.52 kg/m    Subjective:    Patient ID: Debra Walters, female    DOB: [birth year], 32 y.o.   MRN: [id number]  CC: Chief Complaint  Patient presents with   Annual Exam    Concerns with place on palm of right hand, discuss the Zepbound    HPI: Debra Walters is a 32 y.o. female presenting on 07/24/2022 for comprehensive medical examination. Current medical complaints include: rash, weight management  She has a spot on her right palm that will start as a knot or like a pimple, then will crack and peel an area of skin. It doesn't itch or hurt. She denies changes in lotions or soaps. She tried her sister's steroid cream which didn't help. This started about a month ago. Then it will come back again.   She took the last dose of zepbound 2 weeks ago. Her insurance is no longer covering the medication. She was tracking her food on the lose it app. Her appetite is starting to coming back.   She currently lives with: alone Menopausal Symptoms: no  Depression and Anxiety Screen done today and results listed below:     07/24/2022    8:31 AM 04/25/2022    4:27 PM 01/06/2022    4:57 PM 12/05/2021    4:51 PM 12/05/2021    4:06 PM  Depression screen PHQ 2/9  Decreased Interest 1 1 1 3 3   Down, Depressed, Hopeless 1 1 1 3 3   PHQ - 2 Score 2 2 2 6 6   Altered sleeping 1 1 1 3    Tired, decreased energy 1 1 1 3    Change in appetite 1 1 1 3    Feeling bad or failure about yourself  2 1 3 3    Trouble concentrating 2 1 3 3    Moving slowly or fidgety/restless 2 1 1 3    Suicidal thoughts 2 1 0 0   PHQ-9 Score 13 9 12 24    Difficult doing work/chores Somewhat difficult Somewhat difficult Somewhat difficult Very difficult       07/24/2022    8:31 AM 04/25/2022    4:28 PM 01/06/2022    4:57 PM 12/05/2021    4:51 PM  GAD 7 : Generalized  Anxiety Score  Nervous, Anxious, on Edge 1 1 1 3   Control/stop worrying 1 1 1 3   Worry too much - different things 1 1 1 3   Trouble relaxing 1 1 1 3   Restless 1 1 1 3   Easily annoyed or irritable 1 1 2 3   Afraid - awful might happen 0 0 1 3  Total GAD 7 Score 6 6 8 21   Anxiety Difficulty Somewhat difficult Somewhat difficult Somewhat difficult Very difficult    The patient does not have a history of falls. I did not complete a risk assessment for falls. A plan of care for falls was not documented.   Past Medical History:  Past Medical History:  Diagnosis Date   Asthma    GERD (gastroesophageal reflux disease)    Hypertension    Pulmonary embolism (HCC) 06/01/2020    Surgical History:  History reviewed. No pertinent surgical history.  Medications:  Current Outpatient Medications on File Prior to Visit  Medication Sig   albuterol (VENTOLIN  HFA) 108 (90 Base) MCG/ACT inhaler Inhale 1 - 2 puffs into the lungs every 6 hours as needed for shortness of breath or wheezing.   cetirizine (ZYRTEC) 10 MG tablet Take by mouth.   famotidine (PEPCID) 20 MG tablet Take by mouth.   fluticasone-salmeterol (ADVAIR DISKUS) 100-50 MCG/ACT AEPB Inhale 1 puff into the lungs 2 times daily.   hydrochlorothiazide (HYDRODIURIL) 25 MG tablet Take 1 tablet (25 mg total) by mouth daily.   hydrOXYzine (ATARAX) 10 MG tablet Take 1 tablet (10 mg total) by mouth 3 (three) times daily as needed.   levonorgestrel (KYLEENA) 19.5 MG IUD by Intrauterine route.   ondansetron (ZOFRAN) 4 MG tablet Take 1 tablet (4 mg) by mouth every 8 hours as needed for nausea or vomiting.   pantoprazole (PROTONIX) 40 MG tablet Take 1 tablet (40 mg total) by mouth daily.   rivaroxaban (XARELTO) 20 MG TABS tablet Take 1 tablet (20 mg total) by mouth daily with supper.   sertraline (ZOLOFT) 25 MG tablet Take 1 tablet (25 mg total) by mouth daily.   No current facility-administered medications on file prior to visit.    Allergies:   Allergies  Allergen Reactions   Sulfa Antibiotics     Not sure   Misc. Sulfonamide Containing Compounds Rash    Other reaction(s): UNKNOWN Other reaction(s): UNKNOWN     Social History:  Social History   Socioeconomic History   Marital status: Single    Spouse name: Not on file   Number of children: Not on file   Years of education: Not on file   Highest education level: Not on file  Occupational History   Not on file  Tobacco Use   Smoking status: Never   Smokeless tobacco: Never  Vaping Use   Vaping Use: Never used  Substance and Sexual Activity   Alcohol use: Yes    Alcohol/week: 1.0 standard drink of alcohol    Types: 1 drink(s) per week    Comment: monthly   Drug use: No   Sexual activity: Not on file    Comment: Kyleena placed 2022  Other Topics Concern   Not on file  Social History Narrative   Not on file   Social Determinants of Health   Financial Resource Strain: Not on file  Food Insecurity: No Food Insecurity (01/20/2017)   Hunger Vital Sign    Worried About Running Out of Food in the Last Year: Never true    Ran Out of Food in the Last Year: Never true  Transportation Needs: Not on file  Physical Activity: Not on file  Stress: Not on file  Social Connections: Not on file  Intimate Partner Violence: Not on file   Social History   Tobacco Use  Smoking Status Never  Smokeless Tobacco Never   Social History   Substance and Sexual Activity  Alcohol Use Yes   Alcohol/week: 1.0 standard drink of alcohol   Types: 1 drink(s) per week   Comment: monthly    Family History:  Family History  Problem Relation Age of Onset   Hyperlipidemia Mother    Diabetes Mother    Hypertension Mother    Drug abuse Mother    Obesity Mother    Drug abuse Father    Heart disease Father    Hypertension Father    Diabetes Maternal Grandmother    Cancer Maternal Grandmother        breast   Hypertension Maternal Grandmother    Hypertension Maternal  Grandfather  Hypertension Paternal Grandmother    Obesity Paternal Grandfather    Hypertension Paternal Grandfather    Cancer Maternal Aunt        melanoma    Asthma Other    Hyperlipidemia Other    Hypertension Other    Stroke Other    Heart disease Other    Obesity Other     Past medical history, surgical history, medications, allergies, family history and social history reviewed with patient today and changes made to appropriate areas of the chart.   Review of Systems  Constitutional: Negative.   HENT: Negative.    Eyes: Negative.   Respiratory: Negative.    Cardiovascular: Negative.   Gastrointestinal: Negative.   Genitourinary: Negative.   Musculoskeletal: Negative.   Skin:  Positive for rash (right palm).  Neurological: Negative.   Psychiatric/Behavioral: Negative.     All other ROS negative except what is listed above and in the HPI.      Objective:    BP 134/88 (BP Location: Right Wrist)   Pulse 82   Temp 97.7 F (36.5 C)   Ht 5\' 7"  (1.702 m)   Wt (!) 463 lb (210 kg)   LMP 07/21/2022   SpO2 94%   BMI 72.52 kg/m   Wt Readings from Last 3 Encounters:  07/24/22 (!) 463 lb (210 kg)  04/25/22 (!) 461 lb 6.4 oz (209.3 kg)  04/25/22 (!) 462 lb 9.6 oz (209.8 kg)    Physical Exam Vitals and nursing note reviewed.  Constitutional:      General: She is not in acute distress.    Appearance: Normal appearance. She is obese.  HENT:     Head: Normocephalic and atraumatic.     Right Ear: Tympanic membrane, ear canal and external ear normal.     Left Ear: Tympanic membrane, ear canal and external ear normal.  Eyes:     Conjunctiva/sclera: Conjunctivae normal.  Cardiovascular:     Rate and Rhythm: Normal rate and regular rhythm.     Pulses: Normal pulses.     Heart sounds: Normal heart sounds.  Pulmonary:     Effort: Pulmonary effort is normal.     Breath sounds: Normal breath sounds.  Abdominal:     Palpations: Abdomen is soft.     Tenderness: There is no  abdominal tenderness.  Musculoskeletal:        General: Normal range of motion.     Cervical back: Normal range of motion and neck supple.     Right lower leg: No edema.     Left lower leg: No edema.     Comments: Strength 5/5 bilateral upper and lower extremities  Lymphadenopathy:     Cervical: No cervical adenopathy.  Skin:    General: Skin is warm and dry.     Findings: Rash (dry, scaly localized rash to right palm) present.  Neurological:     General: No focal deficit present.     Mental Status: She is alert and oriented to person, place, and time.     Coordination: Coordination normal.     Gait: Gait normal.  Psychiatric:        Mood and Affect: Mood normal.        Behavior: Behavior normal.        Thought Content: Thought content normal.        Judgment: Judgment normal.     Results for orders placed or performed in visit on 04/25/22  CMP ([facility] only)  Result Value Ref  Range   Sodium 140 135 - 145 mmol/L   Potassium 4.1 3.5 - 5.1 mmol/L   Chloride 105 98 - 111 mmol/L   CO2 31 22 - 32 mmol/L   Glucose, Bld 97 70 - 99 mg/dL   BUN 13 6 - 20 mg/dL   Creatinine 1.61 0.96 - 1.00 mg/dL   Calcium 8.8 (L) 8.9 - 10.3 mg/dL   Total Protein 7.3 6.5 - 8.1 g/dL   Albumin 3.7 3.5 - 5.0 g/dL   AST 15 15 - 41 U/L   ALT 22 0 - 44 U/L   Alkaline Phosphatase 84 38 - 126 U/L   Total Bilirubin 0.4 0.3 - 1.2 mg/dL   GFR, Estimated >04 >54 mL/min   Anion gap 4 (L) 5 - 15  CBC with Differential (Can[facility] Center Only)  Result Value Ref Range   WBC Count 9.1 4.0 - 10.5 K/uL   RBC 4.46 3.87 - 5.11 MIL/uL   Hemoglobin 13.1 12.0 - 15.0 g/dL   HCT 09.8 11.9 - 14.7 %   MCV 89.2 80.0 - 100.0 fL   MCH 29.4 26.0 - 34.0 pg   MCHC 32.9 30.0 - 36.0 g/dL   RDW 82.9 56.2 - 13.0 %   Platelet Count 400 150 - 400 K/uL   nRBC 0.0 0.0 - 0.2 %   Neutrophils Relative % 68 %   Neutro Abs 6.1 1.7 - 7.7 K/uL   Lymphocytes Relative 24 %   Lymphs Abs 2.2 0.7 - 4.0 K/uL   Monocytes Relative 4 %    Monocytes Absolute 0.4 0.1 - 1.0 K/uL   Eosinophils Relative 3 %   Eosinophils Absolute 0.3 0.0 - 0.5 K/uL   Basophils Relative 1 %   Basophils Absolute 0.1 0.0 - 0.1 K/uL   Immature Granulocytes 0 %   Abs Immature Granulocytes 0.02 0.00 - 0.07 K/uL      Assessment & Plan:   Problem List Items Addressed This Visit       Cardiovascular and Mediastinum   Primary hypertension    Chronic, stable. Will continue HCTZ 25mg  daily. Last CMP reviewed. Follow-up in 6 months.         Respiratory   Mild persistent asthma without complication    Chronic, stable.  Symptoms are well controlled on Advair and albuterol as needed.  Up to date on pneumonia vaccine.         Other   Morbid obesity (HCC)    BMI 72.5 today. Her insurance stopped covering injectable weight loss medications. She took the last dose of Zepbound 2 weeks ago. Discussed nutrition, tracking food, possibly weight watchers. Continue exercise as able.       Depression, major, single episode, mild (HCC)    Chronic, stable.  Continue sertraline 25 mg daily and following with a therapist.  Follow-up in 6 months or sooner with concerns.      History of pulmonary embolism    She follows with hematology and states that everything is well.  She is continuing to take her Xarelto 20 mg daily.      Routine general medical examination at a health care facility - Primary    Health maintenance reviewed and updated. Discussed nutrition, exercise. Recent CMP, CBC reviewed. Follow-up 1 year.          Follow up plan: Return in about 6 months (around 01/24/2023) for HTN.   LABORATORY TESTING:  - Pap smear:  declines- would like to go to GYN  IMMUNIZATIONS:   -  Tdap: Tetanus vaccination status reviewed: last tetanus booster within 10 years. - Influenza: Up to date - Pneumovax: Up to date - Prevnar: Not applicable - HPV: Not applicable - Zostavax vaccine: Not applicable  SCREENING: -Mammogram: Not applicable  - Colonoscopy:  Not applicable  - Bone Density: Not applicable   PATIENT COUNSELING:   Advised to take 1 mg of folate supplement per day if capable of pregnancy.   Sexuality: Discussed sexually transmitted diseases, partner selection, use of condoms, avoidance of unintended pregnancy  and contraceptive alternatives.   Advised to avoid cigarette smoking.  I discussed with the patient that most people either abstain from alcohol or drink within safe limits (<=14/week and <=4 drinks/occasion for males, <=7/weeks and <= 3 drinks/occasion for females) and that the risk for alcohol disorders and other health effects rises proportionally with the number of drinks per week and how often a drinker exceeds daily limits.  Discussed cessation/primary prevention of drug use and availability of treatment for abuse.   Diet: Encouraged to adjust caloric intake to maintain  or achieve ideal body weight, to reduce intake of dietary saturated fat and total fat, to limit sodium intake by avoiding high sodium foods and not adding table salt, and to maintain adequate dietary potassium and calcium preferably from fresh fruits, vegetables, and low-fat dairy products.    stressed the importance of regular exercise  Injury prevention: Discussed safety belts, safety helmets, smoke detector, smoking near bedding or upholstery.   Dental health: Discussed importance of regular tooth brushing, flossing, and dental visits.    NEXT PREVENTATIVE PHYSICAL DUE IN 1 YEAR. Return in about 6 months (around 01/24/2023) for HTN.

## 2022-07-24 NOTE — Assessment & Plan Note (Signed)
BMI 72.5 today. Her insurance stopped covering injectable weight loss medications. She took the last dose of Zepbound 2 weeks ago. Discussed nutrition, tracking food, possibly weight watchers. Continue exercise as able.

## 2022-07-25 ENCOUNTER — Other Ambulatory Visit (HOSPITAL_COMMUNITY): Payer: Self-pay

## 2022-08-01 ENCOUNTER — Ambulatory Visit (INDEPENDENT_AMBULATORY_CARE_PROVIDER_SITE_OTHER): Payer: 59 | Admitting: Psychology

## 2022-08-01 DIAGNOSIS — F411 Generalized anxiety disorder: Secondary | ICD-10-CM | POA: Diagnosis not present

## 2022-08-01 DIAGNOSIS — F331 Major depressive disorder, recurrent, moderate: Secondary | ICD-10-CM
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
## 2022-08-15 ENCOUNTER — Ambulatory Visit (INDEPENDENT_AMBULATORY_CARE_PROVIDER_SITE_OTHER): Payer: 59 | Admitting: Psychology

## 2022-08-15 DIAGNOSIS — F411 Generalized anxiety disorder: Secondary | ICD-10-CM | POA: Diagnosis not present

## 2022-08-15 DIAGNOSIS — F331 Major depressive disorder, recurrent, moderate: Secondary | ICD-10-CM | POA: Diagnosis not present
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
## 2022-08-28 ENCOUNTER — Other Ambulatory Visit (HOSPITAL_COMMUNITY): Payer: Self-pay

## 2022-08-28 ENCOUNTER — Other Ambulatory Visit: Payer: Self-pay

## 2022-08-29 ENCOUNTER — Ambulatory Visit: Payer: 59 | Admitting: Psychology

## 2022-09-09 ENCOUNTER — Other Ambulatory Visit (HOSPITAL_COMMUNITY): Payer: Self-pay

## 2022-09-12 ENCOUNTER — Ambulatory Visit: Payer: 59 | Admitting: Psychology

## 2022-09-14 ENCOUNTER — Telehealth: Payer: 59 | Admitting: Nurse Practitioner

## 2022-09-14 DIAGNOSIS — J029 Acute pharyngitis, unspecified: Secondary | ICD-10-CM

## 2022-09-14 NOTE — Patient Instructions (Signed)
  Pegah Raimondo, thank you for joining Claiborne Rigg, NP for today's virtual visit.  While this provider is not your primary care provider (PCP), if your PCP is located in our provider database this encounter information will be shared with them immediately following your visit.   A [facility] MyChart account gives you access to today's visit and all your visits, tests, and labs performed at [facility] " click here if you don't have a [facility] MyChart account or go to [email or web address]  Consent: (Patient) Keysi Rohman provided verbal consent for this virtual visit at the beginning of the encounter.  Current Medications:  Current Outpatient Medications:    albuterol (VENTOLIN HFA) 108 (90 Base) MCG/ACT inhaler, Inhale 1 - 2 puffs into the lungs every 6 hours as needed for shortness of breath or wheezing., Disp: 18 g, Rfl: 3   cetirizine (ZYRTEC) 10 MG tablet, Take by mouth., Disp: , Rfl:    famotidine (PEPCID) 20 MG tablet, Take by mouth., Disp: , Rfl:    fluticasone-salmeterol (ADVAIR DISKUS) 100-50 MCG/ACT AEPB, Inhale 1 puff into the lungs 2 times daily., Disp: 60 each, Rfl: 3   hydrochlorothiazide (HYDRODIURIL) 25 MG tablet, Take 1 tablet (25 mg total) by mouth daily., Disp: 90 tablet, Rfl: 0   hydrOXYzine (ATARAX) 10 MG tablet, Take 1 tablet (10 mg total) by mouth 3 (three) times daily as needed., Disp: 30 tablet, Rfl: 1   levonorgestrel (KYLEENA) 19.5 MG IUD, by Intrauterine route., Disp: , Rfl:    ondansetron (ZOFRAN) 4 MG tablet, Take 1 tablet (4 mg) by mouth every 8 hours as needed for nausea or vomiting., Disp: 20 tablet, Rfl: 0   pantoprazole (PROTONIX) 40 MG tablet, Take 1 tablet (40 mg total) by mouth daily., Disp: 90 tablet, Rfl: 1   rivaroxaban (XARELTO) 20 MG TABS tablet, Take 1 tablet (20 mg total) by mouth daily with supper., Disp: 30 tablet, Rfl: 5   sertraline (ZOLOFT) 25 MG tablet, Take 1 tablet (25 mg total) by mouth daily., Disp: 90 tablet,  Rfl: 0   triamcinolone cream (KENALOG) 0.1 %, Apply  topically 2 times daily., Disp: 30 g, Rfl: 0   Medications ordered in this encounter:  No orders of the defined types were placed in this encounter.    *If you need refills on other medications prior to your next appointment, please contact your pharmacy*  Follow-Up: Call back or seek an in-person evaluation if the symptoms worsen or if the condition fails to improve as anticipated.  [facility] [phone]  Other Instructions May alternate with liquid Tylenol and Motrin for sore throat Warm salt water gargles for pain Warm tea with honey to help soothe throat   If you have been instructed to have an in-person evaluation today at a local Urgent Care facility, please use the link below. It will take you to a list of all of our available [facility], including address, phone number and hours of operation. Please do not delay care.  [facility]  If you or a family member do not have a primary care provider, use the link below to schedule a visit and establish care. When you choose a [facility] primary care physician or advanced practice provider, you gain a long-term partner in health. Find a Primary Care Provider  Learn more about [facility] in-office and virtual care options: [facility] - Get Care Now

## 2022-09-14 NOTE — Progress Notes (Signed)
Virtual Visit Consent   Debra Walters, you are scheduled for a virtual visit with a [facility] provider today. Just as with appointments in the office, your consent must be obtained to participate. Your consent will be active for this visit and any virtual visit you may have with one of our providers in the next 365 days. If you have a MyChart account, a copy of this consent can be sent to you electronically.  As this is a virtual visit, video technology does not allow for your provider to perform a traditional examination. This may limit your provider's ability to fully assess your condition. If your provider identifies any concerns that need to be evaluated in person or the need to arrange testing (such as labs, EKG, etc.), we will make arrangements to do so. Although advances in technology are sophisticated, we cannot ensure that it will always work on either your end or our end. If the connection with a video visit is poor, the visit may have to be switched to a telephone visit. With either a video or telephone visit, we are not always able to ensure that we have a secure connection.  By engaging in this virtual visit, you consent to the provision of healthcare and authorize for your insurance to be billed (if applicable) for the services provided during this visit. Depending on your insurance coverage, you may receive a charge related to this service.  I need to obtain your verbal consent now. Are you willing to proceed with your visit today? Francella Tippen has provided verbal consent on 09/14/2022 for a virtual visit (video or telephone). Claiborne Rigg, NP  Date: 09/14/2022 11:27 AM  Virtual Visit via Video Note   I, Claiborne Rigg, connected with  Debra Walters  ([id number], [birth year]) on 09/14/22 at 11:30 AM EDT by a video-enabled telemedicine application and verified that I am speaking with the correct person using two identifiers.  Location: Patient: Virtual Visit Location  Patient: Home Provider: Virtual Visit Location Provider: Home Office   I discussed the limitations of evaluation and management by telemedicine and the availability of in person appointments. The patient expressed understanding and agreed to proceed.    History of Present Illness: Debra Walters is a 32 y.o. who identifies as a female who was assigned female at birth, and is being seen today for sore throat.  Ms [place] states her tonsils are swollen and touching, minimal pain with swallowing, PND. Denies fever, lymphadenopathy, chills, N/V, cough, headache, fatigue. Onset of symptoms yesterday.  Coworker had strep throat a few weeks ago.    Problems:  Patient Active Problem List   Diagnosis Date Noted   Routine general medical examination at a health care facility 07/24/2022   Anxiety 12/05/2021   Essential hypertension 11/05/2021   Vitamin D deficiency 11/05/2021   Class 3 severe obesity with serious comorbidity and body mass index (BMI) greater than or equal to 70 in adult [facility]) 11/05/2021   Morbid obesity (HCC) 04/01/2021   Mild persistent asthma without complication 04/01/2021   Depression, major, single episode, mild (HCC) 04/01/2021   History of pulmonary embolism 04/01/2021   Health care maintenance 04/01/2021   Primary hypertension 05/06/2013   GERD (gastroesophageal reflux disease) 05/06/2013    Allergies:  Allergies  Allergen Reactions   Sulfa Antibiotics     Not sure   Misc. Sulfonamide Containing Compounds Rash    Other reaction(s): UNKNOWN Other reaction(s): UNKNOWN    Medications:  Current Outpatient Medications:  albuterol (VENTOLIN HFA) 108 (90 Base) MCG/ACT inhaler, Inhale 1 - 2 puffs into the lungs every 6 hours as needed for shortness of breath or wheezing., Disp: 18 g, Rfl: 3   cetirizine (ZYRTEC) 10 MG tablet, Take by mouth., Disp: , Rfl:    famotidine (PEPCID) 20 MG tablet, Take by mouth., Disp: , Rfl:    fluticasone-salmeterol (ADVAIR DISKUS)  100-50 MCG/ACT AEPB, Inhale 1 puff into the lungs 2 times daily., Disp: 60 each, Rfl: 3   hydrochlorothiazide (HYDRODIURIL) 25 MG tablet, Take 1 tablet (25 mg total) by mouth daily., Disp: 90 tablet, Rfl: 0   hydrOXYzine (ATARAX) 10 MG tablet, Take 1 tablet (10 mg total) by mouth 3 (three) times daily as needed., Disp: 30 tablet, Rfl: 1   levonorgestrel (KYLEENA) 19.5 MG IUD, by Intrauterine route., Disp: , Rfl:    ondansetron (ZOFRAN) 4 MG tablet, Take 1 tablet (4 mg) by mouth every 8 hours as needed for nausea or vomiting., Disp: 20 tablet, Rfl: 0   pantoprazole (PROTONIX) 40 MG tablet, Take 1 tablet (40 mg total) by mouth daily., Disp: 90 tablet, Rfl: 1   rivaroxaban (XARELTO) 20 MG TABS tablet, Take 1 tablet (20 mg total) by mouth daily with supper., Disp: 30 tablet, Rfl: 5   sertraline (ZOLOFT) 25 MG tablet, Take 1 tablet (25 mg total) by mouth daily., Disp: 90 tablet, Rfl: 0   triamcinolone cream (KENALOG) 0.1 %, Apply  topically 2 times daily., Disp: 30 g, Rfl: 0  Observations/Objective: Patient is well-developed, well-nourished in no acute distress.  Resting comfortably at home.  Head is normocephalic, atraumatic.  No labored breathing.  Speech is clear and coherent with logical content.  Patient is alert and oriented at baseline.    Assessment and Plan: 1. Viral pharyngitis  May alternate with liquid Tylenol and Motrin for sore throat Warm salt water gargles for pain Warm tea with honey to help soothe throat   Follow Up Instructions: I discussed the assessment and treatment plan with the patient. The patient was provided an opportunity to ask questions and all were answered. The patient agreed with the plan and demonstrated an understanding of the instructions.  A copy of instructions were sent to the patient via MyChart unless otherwise noted below.    The patient was advised to call back or seek an in-person evaluation if the symptoms worsen or if the condition fails to  improve as anticipated.  Time:  I spent 11 minutes with the patient via telehealth technology discussing the above problems/concerns.    Claiborne Rigg, NP

## 2022-09-23 ENCOUNTER — Other Ambulatory Visit: Payer: Self-pay | Admitting: Nurse Practitioner

## 2022-09-23 ENCOUNTER — Other Ambulatory Visit (HOSPITAL_COMMUNITY): Payer: Self-pay

## 2022-09-23 ENCOUNTER — Other Ambulatory Visit: Payer: Self-pay

## 2022-09-23 MED ORDER — ONDANSETRON HCL 4 MG PO TABS
4.0000 mg | ORAL_TABLET | Freq: Three times a day (TID) | ORAL | 0 refills | Status: AC | PRN
  Filled 2022-09-23: qty 20, 7d supply, fill #0

## 2022-09-23 MED ORDER — ALBUTEROL SULFATE HFA 108 (90 BASE) MCG/ACT IN AERS
1.0000 | INHALATION_SPRAY | Freq: Four times a day (QID) | RESPIRATORY_TRACT | 3 refills | Status: DC | PRN
  Filled 2022-09-23: qty 6.7, 17d supply, fill #0
  Filled 2022-12-15: qty 6.7, 17d supply, fill #1
  Filled 2023-02-17: qty 6.7, 17d supply, fill #2
  Filled 2023-04-19: qty 6.7, 17d supply, fill #3

## 2022-09-24 ENCOUNTER — Other Ambulatory Visit (HOSPITAL_COMMUNITY): Payer: Self-pay

## 2022-09-24 ENCOUNTER — Other Ambulatory Visit: Payer: Self-pay

## 2022-09-26 ENCOUNTER — Ambulatory Visit: Payer: 59 | Admitting: Psychology

## 2022-10-10 ENCOUNTER — Ambulatory Visit: Payer: 59 | Admitting: Psychology

## 2022-10-23 ENCOUNTER — Other Ambulatory Visit: Payer: Self-pay | Admitting: Hematology and Oncology

## 2022-10-23 ENCOUNTER — Telehealth: Payer: Self-pay | Admitting: Hematology and Oncology

## 2022-10-23 DIAGNOSIS — Z86711 Personal history of pulmonary embolism: Secondary | ICD-10-CM

## 2022-10-24 ENCOUNTER — Inpatient Hospital Stay: Payer: 59 | Admitting: Hematology and Oncology

## 2022-10-24 ENCOUNTER — Ambulatory Visit (INDEPENDENT_AMBULATORY_CARE_PROVIDER_SITE_OTHER): Payer: 59 | Admitting: Psychology

## 2022-10-24 ENCOUNTER — Inpatient Hospital Stay: Payer: 59

## 2022-10-24 DIAGNOSIS — F411 Generalized anxiety disorder: Secondary | ICD-10-CM | POA: Diagnosis not present

## 2022-10-24 DIAGNOSIS — F331 Major depressive disorder, recurrent, moderate: Secondary | ICD-10-CM
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
## 2022-11-07 ENCOUNTER — Inpatient Hospital Stay: Payer: 59 | Attending: Hematology and Oncology

## 2022-11-07 ENCOUNTER — Ambulatory Visit (INDEPENDENT_AMBULATORY_CARE_PROVIDER_SITE_OTHER): Payer: 59 | Admitting: Psychology

## 2022-11-07 ENCOUNTER — Inpatient Hospital Stay (HOSPITAL_BASED_OUTPATIENT_CLINIC_OR_DEPARTMENT_OTHER): Payer: 59 | Admitting: Hematology and Oncology

## 2022-11-07 DIAGNOSIS — E669 Obesity, unspecified: Secondary | ICD-10-CM | POA: Diagnosis not present

## 2022-11-07 DIAGNOSIS — Z86711 Personal history of pulmonary embolism: Secondary | ICD-10-CM | POA: Insufficient documentation

## 2022-11-07 DIAGNOSIS — Z808 Family history of malignant neoplasm of other organs or systems: Secondary | ICD-10-CM | POA: Insufficient documentation

## 2022-11-07 DIAGNOSIS — Z803 Family history of malignant neoplasm of breast: Secondary | ICD-10-CM | POA: Diagnosis not present

## 2022-11-07 DIAGNOSIS — F320 Major depressive disorder, single episode, mild: Secondary | ICD-10-CM

## 2022-11-07 DIAGNOSIS — Z7901 Long term (current) use of anticoagulants: Secondary | ICD-10-CM | POA: Insufficient documentation

## 2022-11-07 DIAGNOSIS — F411 Generalized anxiety disorder: Secondary | ICD-10-CM | POA: Diagnosis not present

## 2022-11-07 LAB — CBC WITH DIFFERENTIAL (CANCER CENTER ONLY)
Abs Immature Granulocytes: 0.02 10*3/uL (ref 0.00–0.07)
Basophils Absolute: 0.1 10*3/uL (ref 0.0–0.1)
Basophils Relative: 1 %
Eosinophils Absolute: 0.2 10*3/uL (ref 0.0–0.5)
Eosinophils Relative: 3 %
HCT: 40 % (ref 36.0–46.0)
Hemoglobin: 13.5 g/dL (ref 12.0–15.0)
Immature Granulocytes: 0 %
Lymphocytes Relative: 26 %
Lymphs Abs: 2 10*3/uL (ref 0.7–4.0)
MCH: 30.5 pg (ref 26.0–34.0)
MCHC: 33.8 g/dL (ref 30.0–36.0)
MCV: 90.5 fL (ref 80.0–100.0)
Monocytes Absolute: 0.4 10*3/uL (ref 0.1–1.0)
Monocytes Relative: 5 %
Neutro Abs: 5 10*3/uL (ref 1.7–7.7)
Neutrophils Relative %: 65 %
Platelet Count: 346 10*3/uL (ref 150–400)
RBC: 4.42 MIL/uL (ref 3.87–5.11)
RDW: 13.7 % (ref 11.5–15.5)
WBC Count: 7.6 10*3/uL (ref 4.0–10.5)
nRBC: 0 % (ref 0.0–0.2)

## 2022-11-07 LAB — CMP (CANCER CENTER ONLY)
ALT: 21 U/L (ref 0–44)
AST: 17 U/L (ref 15–41)
Albumin: 3.9 g/dL (ref 3.5–5.0)
Alkaline Phosphatase: 87 U/L (ref 38–126)
Anion gap: 6 (ref 5–15)
BUN: 9 mg/dL (ref 6–20)
CO2: 29 mmol/L (ref 22–32)
Calcium: 9.7 mg/dL (ref 8.9–10.3)
Chloride: 102 mmol/L (ref 98–111)
Creatinine: 0.87 mg/dL (ref 0.44–1.00)
GFR, Estimated: >60 mL/min (ref >60)
Glucose, Bld: 95 mg/dL (ref 70–99)
Potassium: 4.3 mmol/L (ref 3.5–5.1)
Sodium: 137 mmol/L (ref 135–145)
Total Bilirubin: 0.6 mg/dL (ref 0.3–1.2)
Total Protein: 8 g/dL (ref 6.5–8.1)

## 2022-11-07 NOTE — Progress Notes (Unsigned)
                Bambi G Cottle, LCSW 

## 2022-11-07 NOTE — Progress Notes (Signed)
[facility] [phone]   [phone]  PROGRESS NOTE  Patient Care Team: Gerre Scull, NP as PCP - General (Internal Medicine)  Hematological/Oncological History # Unprovoked Bilateral Pulmonary Emboli 06/06/2020: presented with chest pain and shortness of breath to [facility]. CT angio showed acute pulmonary emboli involving all lobes of the lungs. Overall clot burden is moderate to large.  Lower extremity Dopplers negative. Started on Xarelto therapy.  04/22/2021: establish care with Dr. Leonides Schanz   Interval History:  Debra Walters 32 y.o. female with medical history significant for bilateral pulmonary emboli who presents for a follow up visit. The patient's last visit was on 04/25/2022. In the interim since the last visit she has had no major changes in her health.  On exam today Ms. Skaff reports she had a good Labor Day where she just took it easy.  She reports she enjoyed having a day off.  She notes overall she is "been decent".  She notes that she has had no major changes in her health in the interim since her last visit.  She continues her Xarelto 20 mg p.o. daily with no bleeding, bruising, or dark stools.  She notes the medication still cost her $10 per month.  She is not having any signs or symptoms concerning for recurrent VTE such as leg pain, leg swelling, chest pain, or shortness of breath.  She reports overall her health has been steady and stable.  Unfortunately her apartment was recently infected with black mold and she has been living with a friend while this is being cleaned out.  Overall she is willing and able to continue on Xarelto therapy at this time.  She currently denies any fevers, chills, sweats, nausea, vomiting or diarrhea.  A full 10 point ROS is listed below.  MEDICAL HISTORY:  Past Medical History:  Diagnosis Date   Asthma    GERD (gastroesophageal reflux disease)    Hypertension    Pulmonary embolism (HCC)  06/01/2020    SURGICAL HISTORY: No past surgical history on file.  SOCIAL HISTORY: Social History   Socioeconomic History   Marital status: Single    Spouse name: Not on file   Number of children: Not on file   Years of education: Not on file   Highest education level: Not on file  Occupational History   Not on file  Tobacco Use   Smoking status: Never   Smokeless tobacco: Never  Vaping Use   Vaping status: Never Used  Substance and Sexual Activity   Alcohol use: Yes    Alcohol/week: 1.0 standard drink of alcohol    Types: 1 drink(s) per week    Comment: monthly   Drug use: No   Sexual activity: Not on file    Comment: Kyleena placed 2022  Other Topics Concern   Not on file  Social History Narrative   Not on file   Social Determinants of Health   Financial Resource Strain: Medium Risk (02/27/2021)   Received from [facility], [facility]   Overall Financial Resource Strain (CARDIA)    Difficulty of Paying Living Expenses: Somewhat hard  Food Insecurity: Unknown (02/27/2021)   Received from [facility], [facility]   Hunger Vital Sign    Worried About Running Out of Food in the Last Year: Patient declined    Ran Out of Food in the Last Year: Patient declined  Transportation Needs: No Transportation Needs (02/27/2021)   Received from [facility], [facility]   PRAPARE -  Administrator, Civil Service (Medical): No    Lack of Transportation (Non-Medical): No  Physical Activity: Insufficiently Active (02/27/2021)   Received from [facility], [facility]   Exercise Vital Sign    Days of Exercise per Week: 2 days    Minutes of Exercise per Session: 10 min  Stress: Stress Concern Present (02/27/2021)   Received from [facility], [facility] Occupational [facility] - Occupational Stress Questionnaire    Feeling of Stress : To some extent  Social Connections: Unknown (07/10/2021)   Received from [facility]   Social Network    Social Network: Not on file  Intimate Partner Violence: Unknown (06/07/2021)   Received from [facility], [facility]   HITS    Physically Hurt: Not on file    Insult or Talk Down To: Not on file    Threaten Physical Harm: Not on file    Scream or Curse: Not on file    FAMILY HISTORY: Family History  Problem Relation Age of Onset   Hyperlipidemia Mother    Diabetes Mother    Hypertension Mother    Drug abuse Mother    Obesity Mother    Drug abuse Father    Heart disease Father    Hypertension Father    Diabetes Maternal Grandmother    Cancer Maternal Grandmother        breast   Hypertension Maternal Grandmother    Hypertension Maternal Grandfather    Hypertension Paternal Grandmother    Obesity Paternal Grandfather    Hypertension Paternal Grandfather    Cancer Maternal Aunt        melanoma    Asthma Other    Hyperlipidemia Other    Hypertension Other    Stroke Other    Heart disease Other    Obesity Other     ALLERGIES:  is allergic to sulfa antibiotics and misc. sulfonamide containing compounds.  MEDICATIONS:  Current Outpatient Medications  Medication Sig Dispense Refill   albuterol (VENTOLIN HFA) 108 (90 Base) MCG/ACT inhaler Inhale 1 - 2 puffs into the lungs every 6 hours as needed for shortness of breath or wheezing. 6.7 g 3   cetirizine (ZYRTEC) 10 MG tablet Take by mouth.     famotidine (PEPCID) 20 MG tablet Take by mouth.     fluticasone-salmeterol (ADVAIR DISKUS) 100-50 MCG/ACT AEPB Inhale 1 puff into the lungs 2 times daily. 60 each 3   hydrochlorothiazide (HYDRODIURIL) 25 MG tablet Take 1 tablet (25 mg total) by mouth daily. 90 tablet 0   hydrOXYzine (ATARAX) 10 MG tablet Take 1 tablet (10 mg total) by mouth 3 (three) times daily as needed. 30 tablet 1   levonorgestrel (KYLEENA) 19.5 MG IUD by Intrauterine route.     ondansetron (ZOFRAN) 4 MG tablet Take 1 tablet (4 mg) by mouth every 8 hours as needed for nausea or  vomiting. 20 tablet 0   pantoprazole (PROTONIX) 40 MG tablet Take 1 tablet (40 mg total) by mouth daily. 90 tablet 1   rivaroxaban (XARELTO) 20 MG TABS tablet Take 1 tablet (20 mg total) by mouth daily with supper. 30 tablet 5   sertraline (ZOLOFT) 25 MG tablet Take 1 tablet (25 mg total) by mouth daily. 90 tablet 0   triamcinolone cream (KENALOG) 0.1 % Apply  topically 2 times daily. 30 g 0   No current facility-administered medications for this visit.    REVIEW OF SYSTEMS:   Constitutional: ( - )  fevers, ( - )  chills , ( - ) night sweats Eyes: ( - ) blurriness of vision, ( - ) double vision, ( - ) watery eyes Ears, nose, mouth, throat, and face: ( - ) mucositis, ( - ) sore throat Respiratory: ( - ) cough, ( - ) dyspnea, ( - ) wheezes Cardiovascular: ( - ) palpitation, ( - ) chest discomfort, ( - ) lower extremity swelling Gastrointestinal:  ( - ) nausea, ( - ) heartburn, ( - ) change in bowel habits Skin: ( - ) abnormal skin rashes Lymphatics: ( - ) new lymphadenopathy, ( - ) easy bruising Neurological: ( - ) numbness, ( - ) tingling, ( - ) new weaknesses Behavioral/Psych: ( - ) mood change, ( - ) new changes  All other systems were reviewed with the patient and are negative.  PHYSICAL EXAMINATION:  There were no vitals filed for this visit. There were no vitals filed for this visit.  GENERAL: Well-appearing obese African-American female, alert, no distress and comfortable SKIN: skin color, texture, turgor are normal, no rashes or significant lesions EYES: conjunctiva are pink and non-injected, sclera clear LUNGS: clear to auscultation and percussion with normal breathing effort HEART: regular rate & rhythm and no murmurs and no lower extremity edema Musculoskeletal: no cyanosis of digits and no clubbing  PSYCH: alert & oriented x 3, fluent speech NEURO: no focal motor/sensory deficits  LABORATORY DATA:  I have reviewed the data as listed    Latest Ref Rng & Units 11/07/2022    12:38 PM 04/25/2022    8:52 AM 10/23/2021   10:54 AM  CBC  WBC 4.0 - 10.5 K/uL 7.6  9.1  8.5   Hemoglobin 12.0 - 15.0 g/dL 16.1  09.6  04.5   Hematocrit 36.0 - 46.0 % 40.0  39.8  37.5   Platelets 150 - 400 K/uL 346  400  385        Latest Ref Rng & Units 11/07/2022   12:38 PM 04/25/2022    8:52 AM 10/23/2021   10:54 AM  CMP  Glucose 70 - 99 mg/dL 95  97  409   BUN 6 - 20 mg/dL 9  13  9    Creatinine 0.44 - 1.00 mg/dL 8.11  9.14  7.82   Sodium 135 - 145 mmol/L 137  140  137   Potassium 3.5 - 5.1 mmol/L 4.3  4.1  3.8   Chloride 98 - 111 mmol/L 102  105  100   CO2 22 - 32 mmol/L 29  31  32   Calcium 8.9 - 10.3 mg/dL 9.7  8.8  9.6   Total Protein 6.5 - 8.1 g/dL 8.0  7.3  7.5   Total Bilirubin 0.3 - 1.2 mg/dL 0.6  0.4  0.4   Alkaline Phos 38 - 126 U/L 87  84  84   AST 15 - 41 U/L 17  15  16    ALT 0 - 44 U/L 21  22  21      RADIOGRAPHIC STUDIES: No results found.  ASSESSMENT & PLAN Debra Walters 32 y.o. female with medical history significant for bilateral pulmonary emboli who presents for a follow up visit.   # Unprovoked Bilateral Pulmonary Emboli  -- At this time findings are most consistent with bilateral pulmonary emboli that were unprovoked.  The patient does have morbid obesity which may have contributed, though this does not appear to be a modifiable risk factor --Full hypercoagulable studies performed on 06/06/2020 at [facility]  health showed no clear abnormalities.  Testing include Antithrombin III, protein C, protein S, and lupus anticoagulant testing.  Additionally factor V Leiden and prothrombin gene were tested for and no clear abnormalities were found. Plan:  --Recommend indefinite anticoagulation with Xarelto therapy.  She is currently taking 20 mg p.o. daily.  Given her obesity would not recommend decreasing down to maintenance dosing. --Labs today show white blood cell 7.6, hemoglobin 13.5, MCV 90.5, and platelets of 346.  Creatinine is 0.87 with normal LFTs.  Okay to  continue with DOAC therapy. --Return to clinic in 6 months time to reevaluate   #Mild Anemia--resolved.  -- Hgb normalized to 13.5 today.   No orders of the defined types were placed in this encounter.   All questions were answered. The patient knows to call the clinic with any problems, questions or concerns.  A total of more than 25 minutes were spent on this encounter with face-to-face time and non-face-to-face time, including preparing to see the patient, ordering tests and/or medications, counseling the patient and coordination of care as outlined above.   Ulysees Barns, MD Department of Hematology/Oncology [facility] Phone: [phone] Pager: [phone] Email: [email or web address]  11/07/2022 1:43 PM

## 2022-11-17 ENCOUNTER — Other Ambulatory Visit: Payer: Self-pay

## 2022-11-17 ENCOUNTER — Other Ambulatory Visit: Payer: Self-pay | Admitting: Nurse Practitioner

## 2022-11-18 ENCOUNTER — Other Ambulatory Visit: Payer: Self-pay

## 2022-11-18 ENCOUNTER — Other Ambulatory Visit (HOSPITAL_COMMUNITY): Payer: Self-pay

## 2022-11-18 MED ORDER — RIVAROXABAN 20 MG PO TABS
20.0000 mg | ORAL_TABLET | Freq: Every day | ORAL | 2 refills | Status: DC
  Filled 2022-11-18: qty 30, 30d supply, fill #0
  Filled 2022-12-15: qty 30, 30d supply, fill #1
  Filled 2023-02-17: qty 30, 30d supply, fill #2

## 2022-11-18 MED ORDER — CETIRIZINE HCL 10 MG PO TABS
10.0000 mg | ORAL_TABLET | Freq: Every day | ORAL | 3 refills | Status: DC
  Filled 2022-11-18: qty 90, 90d supply, fill #0

## 2022-11-18 NOTE — Telephone Encounter (Signed)
Requesting: rivaroxaban (XARELTO) 20 MG TABS tablet  Last Visit: 07/24/2022 Next Visit: 12/24/2022 Last Refill: 12/11/2021  Please Advise

## 2022-11-18 NOTE — Telephone Encounter (Signed)
Requesting: cetirizine (ZYRTEC) 10 MG tablet  Last Visit: 07/24/2022 Next Visit: 12/24/2022 Last Refill: Historical Provider  Please Advise

## 2022-11-19 ENCOUNTER — Other Ambulatory Visit (HOSPITAL_COMMUNITY): Payer: Self-pay

## 2022-11-20 ENCOUNTER — Other Ambulatory Visit (HOSPITAL_COMMUNITY): Payer: Self-pay

## 2022-11-21 ENCOUNTER — Ambulatory Visit (INDEPENDENT_AMBULATORY_CARE_PROVIDER_SITE_OTHER): Payer: 59 | Admitting: Psychology

## 2022-11-21 DIAGNOSIS — F320 Major depressive disorder, single episode, mild: Secondary | ICD-10-CM

## 2022-11-21 DIAGNOSIS — F411 Generalized anxiety disorder: Secondary | ICD-10-CM | POA: Diagnosis not present

## 2022-11-21 NOTE — Progress Notes (Unsigned)
                Diona Peregoy G Denario Bagot, LCSW 

## 2022-12-04 ENCOUNTER — Other Ambulatory Visit (HOSPITAL_COMMUNITY): Payer: Self-pay

## 2022-12-05 ENCOUNTER — Ambulatory Visit (INDEPENDENT_AMBULATORY_CARE_PROVIDER_SITE_OTHER): Payer: 59 | Admitting: Psychology

## 2022-12-05 DIAGNOSIS — F331 Major depressive disorder, recurrent, moderate: Secondary | ICD-10-CM

## 2022-12-05 DIAGNOSIS — F320 Major depressive disorder, single episode, mild: Secondary | ICD-10-CM

## 2022-12-05 DIAGNOSIS — F411 Generalized anxiety disorder: Secondary | ICD-10-CM
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
## 2022-12-19 ENCOUNTER — Encounter: Payer: Self-pay | Admitting: Hematology and Oncology

## 2022-12-19 ENCOUNTER — Ambulatory Visit: Payer: 59 | Admitting: Psychology

## 2022-12-19 DIAGNOSIS — F411 Generalized anxiety disorder: Secondary | ICD-10-CM

## 2022-12-19 DIAGNOSIS — F320 Major depressive disorder, single episode, mild: Secondary | ICD-10-CM

## 2022-12-19 NOTE — Progress Notes (Unsigned)
                Elgie Landino G Jerilee Space, LCSW

## 2022-12-24 ENCOUNTER — Other Ambulatory Visit (HOSPITAL_COMMUNITY): Payer: Self-pay

## 2022-12-24 ENCOUNTER — Ambulatory Visit: Payer: 59 | Admitting: Nurse Practitioner

## 2022-12-24 VITALS — BP 132/86 | HR 90 | Temp 97.0°F | Ht 67.0 in | Wt >= 6400 oz

## 2022-12-24 DIAGNOSIS — G43109 Migraine with aura, not intractable, without status migrainosus: Secondary | ICD-10-CM | POA: Diagnosis not present

## 2022-12-24 DIAGNOSIS — F419 Anxiety disorder, unspecified: Secondary | ICD-10-CM | POA: Diagnosis not present

## 2022-12-24 DIAGNOSIS — F320 Major depressive disorder, single episode, mild: Secondary | ICD-10-CM

## 2022-12-24 DIAGNOSIS — I10 Essential (primary) hypertension: Secondary | ICD-10-CM | POA: Diagnosis not present

## 2022-12-24 DIAGNOSIS — Z86711 Personal history of pulmonary embolism: Secondary | ICD-10-CM | POA: Diagnosis not present

## 2022-12-24 MED ORDER — UBRELVY 100 MG PO TABS
100.0000 mg | ORAL_TABLET | Freq: Every day | ORAL | 0 refills | Status: DC | PRN
  Filled 2022-12-24: qty 8, 8d supply, fill #0

## 2022-12-24 NOTE — Assessment & Plan Note (Signed)
BMI 70.8. She has lost another 11 pounds since her last visit. Congratulated her on this! Keep up with smaller portion sizes.

## 2022-12-24 NOTE — Progress Notes (Signed)
Established Patient Office Visit  Subjective   Patient ID: Debra Walters, female    DOB: Nov 09, 1990  Age: 32 y.o. MRN: [id number]  Chief Complaint  Patient presents with   Hypertension    Follow up and frequent headaches    HPI  Discussed the use of AI scribe software for clinical note transcription with the patient, who gave verbal consent to proceed.  History of Present Illness   The patient, with a history of blood clots and currently on Xarelto, presents with headaches that she describes as migraines. The headaches are debilitating, causing visual disturbances described as a "kaleidoscope" in the peripheral vision, and are usually followed by a migraine. The patient reports that the migraines are usually relieved by ibuprofen, but she has been unable to take it due to her current medication. The patient reports that Tylenol does not provide relief. The patient has experienced two migraines in the past month, which is more frequent than usual. The migraines typically last for a day and are accompanied by severe nausea. The patient also reports high blood pressure, which was measured as "ridiculously high" at a recent check. The patient denies chest pain, shortness of breath, and swelling in the legs.  In addition to the migraines and high blood pressure, the patient reports ongoing struggles with depression and anxiety, which are exacerbated by work stress and a recent issue with black mold in her home. The patient is seeing a therapist every two weeks. The patient also reports unintentional weight loss, which she attributes to a decreased appetite. She states that she has never gotten her appetite fully back since stopping the weight loss injectable medication.        12/24/2022    8:33 AM 07/24/2022    8:31 AM 04/25/2022    4:27 PM 01/06/2022    4:57 PM 12/05/2021    4:51 PM  Depression screen PHQ 2/9  Decreased Interest 1 1 1 1 3   Down, Depressed, Hopeless 1 1 1 1 3   PHQ - 2  Score 2 2 2 2 6   Altered sleeping 1 1 1 1 3   Tired, decreased energy 1 1 1 1 3   Change in appetite 1 1 1 1 3   Feeling bad or failure about yourself  1 2 1 3 3   Trouble concentrating 1 2 1 3 3   Moving slowly or fidgety/restless 1 2 1 1 3   Suicidal thoughts 0 2 1 0 0  PHQ-9 Score 8 13 9 12 24   Difficult doing work/chores Somewhat difficult Somewhat difficult Somewhat difficult Somewhat difficult Very difficult      12/24/2022    8:33 AM 07/24/2022    8:31 AM 04/25/2022    4:28 PM 01/06/2022    4:57 PM  GAD 7 : Generalized Anxiety Score  Nervous, Anxious, on Edge 1 1 1 1   Control/stop worrying 1 1 1 1   Worry too much - different things 1 1 1 1   Trouble relaxing 1 1 1 1   Restless 1 1 1 1   Easily annoyed or irritable 3 1 1 2   Afraid - awful might happen 2 0 0 1  Total GAD 7 Score 10 6 6 8   Anxiety Difficulty Somewhat difficult Somewhat difficult Somewhat difficult Somewhat difficult      ROS    Objective:     BP 132/86 (BP Location: Left Wrist, Cuff Size: Large)   Pulse 90   Temp (!) 97 F (36.1 C)   Ht 5\' 7"  (1.702 m)  Wt (!) 452 lb 3.2 oz (205.1 kg)   SpO2 99%   BMI 70.82 kg/m  BP Readings from Last 3 Encounters:  12/24/22 132/86  07/24/22 134/88  04/25/22 132/88   Wt Readings from Last 3 Encounters:  12/24/22 (!) 452 lb 3.2 oz (205.1 kg)  07/24/22 (!) 463 lb (210 kg)  04/25/22 (!) 461 lb 6.4 oz (209.3 kg)      Physical Exam Vitals and nursing note reviewed.  Constitutional:      General: She is not in acute distress.    Appearance: Normal appearance. She is obese.  HENT:     Head: Normocephalic.  Eyes:     Conjunctiva/sclera: Conjunctivae normal.  Pulmonary:     Effort: Pulmonary effort is normal.  Musculoskeletal:     Cervical back: Normal range of motion.  Skin:    General: Skin is warm.  Neurological:     General: No focal deficit present.     Mental Status: She is alert and oriented to person, place, and time.  Psychiatric:        Mood and  Affect: Mood normal.        Behavior: Behavior normal.        Thought Content: Thought content normal.        Judgment: Judgment normal.      Assessment & Plan:   Problem List Items Addressed This Visit       Cardiovascular and Mediastinum   Primary hypertension    Chronic, stable. Continue HCTZ 25mg  daily. Recent BMP and CBC reviewed.       Migraine with aura and without status migrainosus, not intractable - Primary    She reports an increased frequency of migraines characterized by visual aura and severe nausea, though without vomiting. The migraines are debilitating, necessitating isolation in a dark room and sleep. Tylenol has been ineffective, and ibuprofen is contraindicated due to concurrent Xarelto use. We will prescribe Ubrelvy 100mg  to be taken at the onset of migraine symptoms and provide a coupon for a three free thirty days supply of Ubrelvy. Should the migraines persist or if issues with Bernita Raisin arise, she is to contact the office.      Relevant Medications   Ubrogepant (UBRELVY) 100 MG TABS     Other   Morbid obesity (HCC)    BMI 70.8. She has lost another 11 pounds since her last visit. Congratulated her on this! Keep up with smaller portion sizes.       Depression, major, single episode, mild (HCC)    She describes experiencing ups and downs, attributing these mood fluctuations to current stressors at work and home. She will continue her current mental health management plan, including bi-weekly therapy sessions and zoloft 25mg  daily.          History of pulmonary embolism    Recommend wearing compression socks and getting up every 1-2 hours while on the plane when she flies to [place] in January. Also keep taking xarelto as prescribed.       Anxiety    She describes experiencing ups and downs, attributing these mood fluctuations to current stressors at work and home. She will continue her current mental health management plan, including bi-weekly therapy  sessions and zoloft 25mg  daily.           Return in about 6 months (around 06/24/2023) for CPE.    Gerre Scull, NP

## 2022-12-24 NOTE — Assessment & Plan Note (Signed)
Chronic, stable. Continue HCTZ 25mg  daily. Recent BMP and CBC reviewed.

## 2022-12-24 NOTE — Assessment & Plan Note (Signed)
She reports an increased frequency of migraines characterized by visual aura and severe nausea, though without vomiting. The migraines are debilitating, necessitating isolation in a dark room and sleep. Tylenol has been ineffective, and ibuprofen is contraindicated due to concurrent Xarelto use. We will prescribe Ubrelvy 100mg  to be taken at the onset of migraine symptoms and provide a coupon for a three free thirty days supply of Ubrelvy. Should the migraines persist or if issues with Bernita Raisin arise, she is to contact the office.

## 2022-12-24 NOTE — Assessment & Plan Note (Signed)
She describes experiencing ups and downs, attributing these mood fluctuations to current stressors at work and home. She will continue her current mental health management plan, including bi-weekly therapy sessions and zoloft 25mg  daily.

## 2022-12-24 NOTE — Patient Instructions (Signed)
VISIT SUMMARY:  During today's visit, we discussed your recent health concerns, including your migraines, high blood pressure, and mental health. We also reviewed your general health maintenance and provided recommendations for your upcoming flight to [facility].  YOUR PLAN:  -HYPERTENSION: Hypertension means high blood pressure. We will recheck your blood pressure today to monitor your condition.  -MIGRAINES: Migraines are severe headaches often accompanied by visual disturbances and nausea. We have prescribed Ubrelvy to take at the onset of migraine symptoms and provided a coupon for a free thirty-day supply. If your migraines persist or you have issues with the medication, please contact our office.  -DEPRESSION/ANXIETY: Depression and anxiety are mental health conditions that can cause mood fluctuations. You will continue with your current mental health management plan, including bi-weekly therapy sessions.  -GENERAL HEALTH MAINTENANCE: You are due for a Pap smear, which can be scheduled for your next physical in May or with a new GYN. For your upcoming flight to [facility], continue taking Xarelto, use compression socks, and make sure to walk around every couple of hours during the flight.  INSTRUCTIONS:  Follow-up is scheduled for six months or sooner if your migraines do not improve or if there are issues with the new medication.

## 2022-12-24 NOTE — Assessment & Plan Note (Signed)
Recommend wearing compression socks and getting up every 1-2 hours while on the plane when she flies to [place] in January. Also keep taking xarelto as prescribed.

## 2022-12-25 ENCOUNTER — Other Ambulatory Visit (HOSPITAL_COMMUNITY): Payer: Self-pay

## 2023-01-02 ENCOUNTER — Ambulatory Visit: Payer: 59 | Admitting: Psychology

## 2023-01-16 ENCOUNTER — Ambulatory Visit (INDEPENDENT_AMBULATORY_CARE_PROVIDER_SITE_OTHER): Payer: 59 | Admitting: Psychology

## 2023-01-16 DIAGNOSIS — F320 Major depressive disorder, single episode, mild: Secondary | ICD-10-CM

## 2023-01-16 DIAGNOSIS — F411 Generalized anxiety disorder: Secondary | ICD-10-CM | POA: Diagnosis not present
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
## 2023-01-21 ENCOUNTER — Ambulatory Visit: Payer: 59 | Admitting: Nurse Practitioner

## 2023-01-30 ENCOUNTER — Ambulatory Visit: Payer: 59 | Admitting: Psychology

## 2023-02-06 ENCOUNTER — Encounter: Payer: Self-pay | Admitting: Nurse Practitioner

## 2023-02-06 ENCOUNTER — Ambulatory Visit (INDEPENDENT_AMBULATORY_CARE_PROVIDER_SITE_OTHER): Payer: 59 | Admitting: Nurse Practitioner

## 2023-02-06 ENCOUNTER — Other Ambulatory Visit (HOSPITAL_COMMUNITY): Payer: Self-pay

## 2023-02-06 VITALS — BP 118/84 | HR 80 | Temp 97.3°F | Ht 66.0 in | Wt >= 6400 oz

## 2023-02-06 DIAGNOSIS — F909 Attention-deficit hyperactivity disorder, unspecified type: Secondary | ICD-10-CM | POA: Diagnosis not present

## 2023-02-06 DIAGNOSIS — F419 Anxiety disorder, unspecified: Secondary | ICD-10-CM

## 2023-02-06 DIAGNOSIS — F320 Major depressive disorder, single episode, mild: Secondary | ICD-10-CM

## 2023-02-06 MED ORDER — BUPROPION HCL ER (XL) 150 MG PO TB24
150.0000 mg | ORAL_TABLET | Freq: Every day | ORAL | 0 refills | Status: DC
  Filled 2023-02-06: qty 90, 90d supply, fill #0

## 2023-02-06 NOTE — Progress Notes (Signed)
Established Patient Office Visit  Subjective   Patient ID: Debra Walters, female    DOB: 03/05/1990  Age: 32 y.o. MRN: [id number]  Chief Complaint  Patient presents with   Concerns with Attention Span and Behavior    HPI  Discussed the use of AI scribe software for clinical note transcription with the patient, who gave verbal consent to proceed.  History of Present Illness   The patient, with a history of anxiety and depression, presents with a few months of restless legs, difficulty focusing, and worsening anxiety. She describes a sensation of her legs 'running on the inside' that is almost constant throughout the day, but not noticed during sleep. This symptom is described as 'annoying' and is not associated with any other neurological symptoms.  The patient also reports difficulty focusing, particularly at work. She describes being easily sidetracked, starting multiple tasks but not completing them, and having a consistent to-do list for the past month. She has tried various strategies to manage this, including limiting caffeine intake, adjusting sleep patterns, and delegating tasks, but has not noticed any improvement. 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  Her anxiety has been heightened recently, but she is unsure if this is situational or if she needs additional support. She has been contemplating life choices and is concerned about her mother's upcoming surgery. She has been seeing a therapist, but feels she has hit a plateau with this.  The patient also reports a slight increase in her depression, but attributes this to her current life contemplations and concerns about her mother. She denies any suicidal ideation.  She has a history of anxiety and depression, for which she is taking Zoloft. She has previously taken Wellbutrin as part of a weight loss medication (Contrave), but not as a standalone medication.        02/06/2023    3:33 PM  12/24/2022    8:33 AM 07/24/2022    8:31 AM 04/25/2022    4:27 PM 01/06/2022    4:57 PM  Depression screen PHQ 2/9  Decreased Interest 2 1 1 1 1   Down, Depressed, Hopeless 2 1 1 1 1   PHQ - 2 Score 4 2 2 2 2   Altered sleeping 2 1 1 1 1   Tired, decreased energy 2 1 1 1 1   Change in appetite 2 1 1 1 1   Feeling bad or failure about yourself  3 1 2 1 3   Trouble concentrating 3 1 2 1 3   Moving slowly or fidgety/restless 3 1 2 1 1   Suicidal thoughts 0 0 2 1 0  PHQ-9 Score 19 8 13 9 12   Difficult doing work/chores Somewhat difficult Somewhat difficult Somewhat difficult Somewhat difficult Somewhat difficult      02/06/2023    3:33 PM 12/24/2022    8:33 AM 07/24/2022    8:31 AM 04/25/2022    4:28 PM  GAD 7 : Generalized Anxiety Score  Nervous, Anxious, on Edge 2 1 1 1   Control/stop worrying 2 1 1 1   Worry too much - different things 2 1 1 1   Trouble relaxing 2 1 1 1   Restless 2 1 1 1   Easily annoyed or irritable 2 3 1 1   Afraid - awful might happen 2 2 0 0  Total GAD 7 Score 14 10 6 6   Anxiety Difficulty Somewhat difficult Somewhat difficult Somewhat difficult Somewhat difficult    Adult ADHD Self Report Scale (most recent)     Adult  ADHD Self-Report Scale (ASRS-v1.1) Symptom Checklist - 02/06/23 1533       Part A   1. How often do you have trouble wrapping up the final details of a project, once the challenging parts have been done? Very Often  2. How often do you have difficulty getting things done in order when you have to do a task that requires organization? Very Often    3. How often do you have problems remembering appointments or obligations? Very Often  4. When you have a task that requires a lot of thought, how often do you avoid or delay getting started? Very Often    5. How often do you fidget or squirm with your hands or feet when you have to sit down for a long time? Very Often  6. How often do you feel overly active and compelled to do things, like you were driven by a  motor? Very Often      Part B   7. How often do you make careless mistakes when you have to work on a boring or difficult project? Often  8. How often do you have difficulty keeping your attention when you are doing boring or repetitive work? Very Often    9. How often do you have difficulty concentrating on what people say to you, even when they are speaking to you directly? Very Often  10. How often do you misplace or have difficulty finding things at home or at work? Very Often    11. How often are you distracted by activity or noise around you? Very Often  12. How often do you leave your seat in meetings or other situations in which you are expected to remain seated? Sometimes    13. How often do you feel restless or fidgety? Very Often  14. How often do you have difficulty unwinding and relaxing when you have time to yourself? Rarely    15. How often do you find yourself talking too much when you are in social situations? Sometimes  16. When you are in a conversation, how often do you find yourself finishing the sentences of the people you are talking to, before they can finish them themselves? Very Often    17. How often do you have difficulty waiting your turn in situations when turn taking is required? Often  18. How often do you interrupt others when they are busy? Very Often                ROS See pertinent positives and negatives per HPI.    Objective:     BP 118/84 (BP Location: Left Wrist, Cuff Size: Large)   Pulse 80   Temp (!) 97.3 F (36.3 C)   Ht 5\' 6"  (1.676 m)   Wt (!) 461 lb 12.8 oz (209.5 kg)   SpO2 99%   BMI 74.54 kg/m  BP Readings from Last 3 Encounters:  02/06/23 118/84  12/24/22 132/86  07/24/22 134/88   Wt Readings from Last 3 Encounters:  02/06/23 (!) 461 lb 12.8 oz (209.5 kg)  12/24/22 (!) 452 lb 3.2 oz (205.1 kg)  07/24/22 (!) 463 lb (210 kg)      Physical Exam Vitals and nursing note reviewed.  Constitutional:      General: She is not in  acute distress.    Appearance: Normal appearance.  HENT:     Head: Normocephalic.  Eyes:     Conjunctiva/sclera: Conjunctivae normal.  Cardiovascular:     Rate and  Rhythm: Normal rate and regular rhythm.     Pulses: Normal pulses.     Heart sounds: Normal heart sounds.  Pulmonary:     Effort: Pulmonary effort is normal.     Breath sounds: Normal breath sounds.  Musculoskeletal:     Cervical back: Normal range of motion.  Skin:    General: Skin is warm.  Neurological:     General: No focal deficit present.     Mental Status: She is alert and oriented to person, place, and time.  Psychiatric:        Mood and Affect: Mood normal.        Behavior: Behavior normal.        Thought Content: Thought content normal.        Judgment: Judgment normal.      Assessment & Plan:   Problem List Items Addressed This Visit       Other   Depression, major, single episode, mild (HCC) - Primary    She has reported heightened anxiety and depression, contemplating life choices and future, and is currently on Zoloft. She denies SI/HI. We will continue Zoloft 25mg  daily and monitor her response to the addition of Wellbutrin.       Relevant Medications   buPROPion (WELLBUTRIN XL) 150 MG 24 hr tablet   Anxiety    She has reported heightened anxiety and depression, contemplating life choices and future, and is currently on Zoloft. She denies SI/HI. We will continue Zoloft 25mg  daily and monitor her response to the addition of Wellbutrin.       Relevant Medications   buPROPion (WELLBUTRIN XL) 150 MG 24 hr tablet   Attention deficit hyperactivity disorder (ADHD)    She reports difficulty focusing and completing tasks at work, which has led to frustration and impacted her self-confidence. Despite attempts at lifestyle modifications such as reducing caffeine intake and delegating tasks, there has been no improvement. We will start Wellbutrin XL 150mg  once daily in the morning with food to address both  anxiety and ADD symptoms, while continuing Zoloft 25mg  daily. Follow-up in 4-6 weeks.        Return in about 4 weeks (around 03/06/2023) for 4-6 weeks , Anxiety, Depression, adhd.    Gerre Scull, NP

## 2023-02-06 NOTE — Assessment & Plan Note (Signed)
She has reported heightened anxiety and depression, contemplating life choices and future, and is currently on Zoloft. She denies SI/HI. We will continue Zoloft 25mg  daily and monitor her response to the addition of Wellbutrin.

## 2023-02-06 NOTE — Patient Instructions (Signed)
It was great to see you!  Start wellbutrin 1 tablet daily in the morning with food   Keep taking your zoloft daily.   Let's follow-up in 4-6 weeks, sooner if you have concerns.  If a referral was placed today, you will be contacted for an appointment. Please note that routine referrals can sometimes take up to 3-4 weeks to process. Please call our office if you haven't heard anything after this time frame.  Take care,  Rodman Pickle, NP

## 2023-02-06 NOTE — Assessment & Plan Note (Signed)
She reports difficulty focusing and completing tasks at work, which has led to frustration and impacted her self-confidence. Despite attempts at lifestyle modifications such as reducing caffeine intake and delegating tasks, there has been no improvement. We will start Wellbutrin XL 150mg  once daily in the morning with food to address both anxiety and ADD symptoms, while continuing Zoloft 25mg  daily. Follow-up in 4-6 weeks.

## 2023-02-09 ENCOUNTER — Other Ambulatory Visit (HOSPITAL_COMMUNITY): Payer: Self-pay

## 2023-02-13 ENCOUNTER — Ambulatory Visit: Payer: 59 | Admitting: Psychology

## 2023-02-13 DIAGNOSIS — F320 Major depressive disorder, single episode, mild: Secondary | ICD-10-CM

## 2023-02-13 DIAGNOSIS — F411 Generalized anxiety disorder: Secondary | ICD-10-CM | POA: Diagnosis not present

## 2023-02-13 NOTE — Progress Notes (Unsigned)
                Diona Peregoy G Denario Bagot, LCSW 

## 2023-02-17 ENCOUNTER — Other Ambulatory Visit: Payer: Self-pay | Admitting: Nurse Practitioner

## 2023-02-18 ENCOUNTER — Other Ambulatory Visit: Payer: Self-pay

## 2023-02-18 ENCOUNTER — Other Ambulatory Visit (HOSPITAL_COMMUNITY): Payer: Self-pay

## 2023-02-18 MED ORDER — PANTOPRAZOLE SODIUM 40 MG PO TBEC
40.0000 mg | DELAYED_RELEASE_TABLET | Freq: Every day | ORAL | 1 refills | Status: DC
  Filled 2023-02-18: qty 90, 90d supply, fill #0
  Filled 2023-04-19 – 2023-06-10 (×3): qty 90, 90d supply, fill #1

## 2023-02-18 MED ORDER — SERTRALINE HCL 25 MG PO TABS
25.0000 mg | ORAL_TABLET | Freq: Every day | ORAL | 0 refills | Status: DC
  Filled 2023-02-18: qty 90, 90d supply, fill #0

## 2023-02-18 MED ORDER — UBRELVY 100 MG PO TABS
100.0000 mg | ORAL_TABLET | Freq: Every day | ORAL | 0 refills | Status: DC | PRN
  Filled 2023-02-18: qty 8, 8d supply, fill #0

## 2023-02-18 NOTE — Telephone Encounter (Signed)
Requesting: pantoprazole (PROTONIX) 40 MG tablet, sertraline (ZOLOFT) 25 MG tablet, Ubrogepant (UBRELVY) 100 MG TABS  Last Visit: 02/06/2023 Next Visit: 03/11/2023 Last Refill: 03/24/2022, 07/10/2022, 12/24/2022  Please Advise

## 2023-02-19 ENCOUNTER — Other Ambulatory Visit (HOSPITAL_COMMUNITY): Payer: Self-pay

## 2023-02-27 ENCOUNTER — Ambulatory Visit: Payer: 59 | Admitting: Psychology

## 2023-02-27 NOTE — Progress Notes (Signed)
                Diona Peregoy G Denario Bagot, LCSW 

## 2023-03-11 ENCOUNTER — Ambulatory Visit: Payer: 59 | Admitting: Nurse Practitioner

## 2023-03-13 ENCOUNTER — Telehealth: Payer: Self-pay | Admitting: Nurse Practitioner

## 2023-03-13 ENCOUNTER — Ambulatory Visit: Payer: 59 | Admitting: Psychology

## 2023-03-13 NOTE — Telephone Encounter (Signed)
 Noted.

## 2023-03-13 NOTE — Telephone Encounter (Signed)
 04/09/2022 no show 03/11/2023 no show  Final warning sent via mail and mychart

## 2023-03-27 ENCOUNTER — Ambulatory Visit: Payer: 59 | Admitting: Psychology

## 2023-04-10 ENCOUNTER — Ambulatory Visit: Payer: 59 | Admitting: Psychology

## 2023-04-10 ENCOUNTER — Telehealth: Payer: Self-pay | Admitting: Physician Assistant

## 2023-04-10 NOTE — Telephone Encounter (Signed)
 Rescheduled appointments per provider on Pal. Patient was left a voicemail with the rescheduled date and times. Patient is active on MyChart.

## 2023-04-19 ENCOUNTER — Other Ambulatory Visit (HOSPITAL_COMMUNITY): Payer: Self-pay

## 2023-04-19 ENCOUNTER — Other Ambulatory Visit: Payer: Self-pay | Admitting: Nurse Practitioner

## 2023-04-20 ENCOUNTER — Other Ambulatory Visit (HOSPITAL_COMMUNITY): Payer: Self-pay

## 2023-04-20 ENCOUNTER — Other Ambulatory Visit: Payer: Self-pay

## 2023-04-20 MED ORDER — UBRELVY 100 MG PO TABS
100.0000 mg | ORAL_TABLET | Freq: Every day | ORAL | 0 refills | Status: DC | PRN
  Filled 2023-04-20: qty 8, 30d supply, fill #0

## 2023-04-20 MED ORDER — RIVAROXABAN 20 MG PO TABS
20.0000 mg | ORAL_TABLET | Freq: Every day | ORAL | 2 refills | Status: DC
  Filled 2023-04-20: qty 30, 30d supply, fill #0
  Filled 2023-05-27 – 2023-06-10 (×2): qty 30, 30d supply, fill #1
  Filled 2023-07-29 – 2023-08-12 (×2): qty 30, 30d supply, fill #2

## 2023-04-20 NOTE — Telephone Encounter (Signed)
Last Ov 02/06/23

## 2023-04-21 ENCOUNTER — Other Ambulatory Visit (HOSPITAL_COMMUNITY): Payer: Self-pay

## 2023-04-22 ENCOUNTER — Telehealth (INDEPENDENT_AMBULATORY_CARE_PROVIDER_SITE_OTHER): Payer: 59 | Admitting: Nurse Practitioner

## 2023-04-22 ENCOUNTER — Encounter: Payer: Self-pay | Admitting: Nurse Practitioner

## 2023-04-22 ENCOUNTER — Other Ambulatory Visit (HOSPITAL_COMMUNITY): Payer: Self-pay

## 2023-04-22 DIAGNOSIS — F909 Attention-deficit hyperactivity disorder, unspecified type: Secondary | ICD-10-CM

## 2023-04-22 DIAGNOSIS — G43109 Migraine with aura, not intractable, without status migrainosus: Secondary | ICD-10-CM

## 2023-04-22 DIAGNOSIS — F419 Anxiety disorder, unspecified: Secondary | ICD-10-CM | POA: Diagnosis not present

## 2023-04-22 DIAGNOSIS — F320 Major depressive disorder, single episode, mild: Secondary | ICD-10-CM

## 2023-04-22 MED ORDER — BUPROPION HCL ER (XL) 300 MG PO TB24
300.0000 mg | ORAL_TABLET | Freq: Every day | ORAL | 0 refills | Status: DC
  Filled 2023-04-22: qty 90, 90d supply, fill #0

## 2023-04-22 NOTE — Assessment & Plan Note (Signed)
Chronic, stable.  She is doing really well with the Ubrelvy 100 mg as needed.  She states that this is the first medication that has actually stopped her migraine when she has one.  Will continue this as prescribed.

## 2023-04-22 NOTE — Assessment & Plan Note (Signed)
Chronic, ongoing.  She notes that the addition of the Wellbutrin has helped slightly with her symptoms.  Will continue sertraline 25 mg daily and increase Wellbutrin to 300 mg daily.  Discussed possible side effects.  She denies SI/HI.  PHQ-9 has improved from a 19 to an 11.  Follow-up in 3 months.

## 2023-04-22 NOTE — Assessment & Plan Note (Signed)
Chronic, improving.  She states that her focus has gotten slightly better with starting Wellbutrin.  Will increase this to 300 mg daily.  Follow-up in 2 to 3 months.

## 2023-04-22 NOTE — Patient Instructions (Signed)
It was great to see you!  Let's increase your wellbutrin to 300mg  daily. You can take 2 of your current pills, then when you get the new bottle, only take 1.   Keep taking the zoloft  Let's follow-up in 3 months, sooner if you have concerns.  If a referral was placed today, you will be contacted for an appointment. Please note that routine referrals can sometimes take up to 3-4 weeks to process. Please call our office if you haven't heard anything after this time frame.  Take care,  Rodman Pickle, NP

## 2023-04-22 NOTE — Progress Notes (Signed)
[facility] [address] [place] [address] Dept: [phone] Dept Fax: [phone]  Virtual Video Visit  I connected with Jaemarie Wolak on 04/22/23 at  8:40 AM EST by a video enabled telemedicine application and verified that I am speaking with the correct person using two identifiers.  Location patient: Home Location provider: Clinic Persons participating in the virtual visit: Patient; Rodman Pickle, NP; Malena Peer, CMA  I discussed the limitations of evaluation and management by telemedicine and the availability of in person appointments. The patient expressed understanding and agreed to proceed.  Chief Complaint  Patient presents with   Depression    Follow-up on depression and anxiety. Doing slightly better    SUBJECTIVE:  HPI: Debra Walters is a 33 y.o. female who presents to follow-up on depression and anxiety.   She started the Wellbutrin in addition to her Zoloft and states that she is doing slightly better.  She notices that she has some improvement in concentration and her depression and anxiety have slightly improved as well.  She is still having some generalized anxiety and trouble sleeping.  She denies SI/HI and side effects to the medication.  She is interested in increasing the Wellbutrin dose to see if it can help slightly more with concentration.  She also notes that the probably is doing really well and stopping her migraines.  She has not had any side effects to this and would like to continue it.     04/22/2023    8:36 AM 02/06/2023    3:33 PM 12/24/2022    8:33 AM 07/24/2022    8:31 AM 04/25/2022    4:27 PM  Depression screen PHQ 2/9  Decreased Interest 1 2 1 1 1   Down, Depressed, Hopeless 1 2 1 1 1   PHQ - 2 Score 2 4 2 2 2   Altered sleeping 2 2 1 1 1   Tired, decreased energy 1 2 1 1 1   Change in appetite 3 2 1 1 1   Feeling bad or failure about yourself  2 3 1 2 1   Trouble  concentrating 1 3 1 2 1   Moving slowly or fidgety/restless 0 3 1 2 1   Suicidal thoughts 0 0 0 2 1  PHQ-9 Score 11 19 8 13 9   Difficult doing work/chores  Somewhat difficult Somewhat difficult Somewhat difficult Somewhat difficult      04/22/2023    8:38 AM 02/06/2023    3:33 PM 12/24/2022    8:33 AM 07/24/2022    8:31 AM  GAD 7 : Generalized Anxiety Score  Nervous, Anxious, on Edge 2 2 1 1   Control/stop worrying 1 2 1 1   Worry too much - different things 3 2 1 1   Trouble relaxing 1 2 1 1   Restless 1 2 1 1   Easily annoyed or irritable 1 2 3 1   Afraid - awful might happen 1 2 2  0  Total GAD 7 Score 10 14 10 6   Anxiety Difficulty  Somewhat difficult Somewhat difficult Somewhat difficult     Patient Active Problem List   Diagnosis Date Noted   Attention deficit hyperactivity disorder (ADHD) 02/06/2023   Migraine with aura and without status migrainosus, not intractable 12/24/2022   Routine general medical examination at a health care facility 07/24/2022   Anxiety 12/05/2021   Essential hypertension 11/05/2021   Vitamin D deficiency 11/05/2021   Class 3 severe obesity with serious comorbidity and body mass index (BMI) greater than or equal to  70 in adult [facility]) 11/05/2021   Morbid obesity (HCC) 04/01/2021   Mild persistent asthma without complication 04/01/2021   Depression, major, single episode, mild (HCC) 04/01/2021   History of pulmonary embolism 04/01/2021   Health care maintenance 04/01/2021   Primary hypertension 05/06/2013   GERD (gastroesophageal reflux disease) 05/06/2013    History reviewed. No pertinent surgical history.  Family History  Problem Relation Age of Onset   Hyperlipidemia Mother    Diabetes Mother    Hypertension Mother    Drug abuse Mother    Obesity Mother    Drug abuse Father    Heart disease Father    Hypertension Father    Diabetes Maternal Grandmother    Cancer Maternal Grandmother        breast   Hypertension Maternal Grandmother     Hypertension Maternal Grandfather    Hypertension Paternal Grandmother    Obesity Paternal Grandfather    Hypertension Paternal Grandfather    Cancer Maternal Aunt        melanoma    Asthma Other    Hyperlipidemia Other    Hypertension Other    Stroke Other    Heart disease Other    Obesity Other     Social History   Tobacco Use   Smoking status: Never   Smokeless tobacco: Never  Vaping Use   Vaping status: Never Used  Substance Use Topics   Alcohol use: Yes    Alcohol/week: 1.0 standard drink of alcohol    Types: 1 drink(s) per week    Comment: monthly   Drug use: No     Current Outpatient Medications:    buPROPion (WELLBUTRIN XL) 300 MG 24 hr tablet, Take 1 tablet (300 mg total) by mouth daily., Disp: 90 tablet, Rfl: 0   albuterol (VENTOLIN HFA) 108 (90 Base) MCG/ACT inhaler, Inhale 1 - 2 puffs into the lungs every 6 hours as needed for shortness of breath or wheezing., Disp: 6.7 g, Rfl: 3   cetirizine (ZYRTEC) 10 MG tablet, Take 1 tablet (10 mg total) by mouth daily., Disp: 90 tablet, Rfl: 3   famotidine (PEPCID) 20 MG tablet, Take by mouth., Disp: , Rfl:    fluticasone-salmeterol (ADVAIR DISKUS) 100-50 MCG/ACT AEPB, Inhale 1 puff into the lungs 2 times daily., Disp: 60 each, Rfl: 3   hydrochlorothiazide (HYDRODIURIL) 25 MG tablet, Take 1 tablet (25 mg total) by mouth daily., Disp: 90 tablet, Rfl: 0   hydrOXYzine (ATARAX) 10 MG tablet, Take 1 tablet (10 mg total) by mouth 3 (three) times daily as needed., Disp: 30 tablet, Rfl: 1   levonorgestrel (KYLEENA) 19.5 MG IUD, by Intrauterine route., Disp: , Rfl:    ondansetron (ZOFRAN) 4 MG tablet, Take 1 tablet (4 mg) by mouth every 8 hours as needed for nausea or vomiting., Disp: 20 tablet, Rfl: 0   pantoprazole (PROTONIX) 40 MG tablet, Take 1 tablet (40 mg total) by mouth daily., Disp: 90 tablet, Rfl: 1   rivaroxaban (XARELTO) 20 MG TABS tablet, Take 1 tablet (20 mg total) by mouth daily with supper., Disp: 30 tablet, Rfl: 2    sertraline (ZOLOFT) 25 MG tablet, Take 1 tablet (25 mg total) by mouth daily., Disp: 90 tablet, Rfl: 0   triamcinolone cream (KENALOG) 0.1 %, Apply  topically 2 times daily., Disp: 30 g, Rfl: 0   Ubrogepant (UBRELVY) 100 MG TABS, Take 1 tablet (100 mg total) by mouth daily as needed (migraine). You can take a second dose in 2 hours if needed,  Disp: 8 tablet, Rfl: 0  Allergies  Allergen Reactions   Sulfa Antibiotics     Not sure   Misc. Sulfonamide Containing Compounds Rash    Other reaction(s): UNKNOWN Other reaction(s): UNKNOWN     ROS: See pertinent positives and negatives per HPI.  OBSERVATIONS/OBJECTIVE:  VITALS per patient if applicable: Today's Vitals   There is no height or weight on file to calculate BMI.    GENERAL: Alert and oriented. Appears well and in no acute distress.  HEENT: Atraumatic. Conjunctiva clear. No obvious abnormalities on inspection of external nose and ears.  NECK: Normal movements of the head and neck.  LUNGS: On inspection, no signs of respiratory distress. Breathing rate appears normal. No obvious gross SOB, gasping or wheezing, and no conversational dyspnea.  CV: No obvious cyanosis.  MS: Moves all visible extremities without noticeable abnormality.  PSYCH/NEURO: Pleasant and cooperative. No obvious depression or anxiety. Speech and thought processing grossly intact.  ASSESSMENT AND PLAN:  Problem List Items Addressed This Visit       Cardiovascular and Mediastinum   Migraine with aura and without status migrainosus, not intractable   Chronic, stable.  She is doing really well with the Ubrelvy 100 mg as needed.  She states that this is the first medication that has actually stopped her migraine when she has one.  Will continue this as prescribed.      Relevant Medications   buPROPion (WELLBUTRIN XL) 300 MG 24 hr tablet     Other   Depression, major, single episode, mild (HCC) - Primary   Chronic, ongoing.  She notes that the  addition of the Wellbutrin has helped slightly with her symptoms.  Will continue sertraline 25 mg daily and increase Wellbutrin to 300 mg daily.  Discussed possible side effects.  She denies SI/HI.  PHQ-9 has improved from a 19 to an 11.  Follow-up in 3 months.      Relevant Medications   buPROPion (WELLBUTRIN XL) 300 MG 24 hr tablet   Anxiety   Chronic, improving.  GAD-7 is at 10 which has improved from a 14.  Continue sertraline 25 mg daily and Wellbutrin 300 mg daily.  She is not currently doing therapy at the moment due to insurance.  Discussed that her work does offer some free counseling through talk space.  Follow-up in 3 months.      Relevant Medications   buPROPion (WELLBUTRIN XL) 300 MG 24 hr tablet   Attention deficit hyperactivity disorder (ADHD)   Chronic, improving.  She states that her focus has gotten slightly better with starting Wellbutrin.  Will increase this to 300 mg daily.  Follow-up in 2 to 3 months.        I discussed the assessment and treatment plan with the patient. The patient was provided an opportunity to ask questions and all were answered. The patient agreed with the plan and demonstrated an understanding of the instructions.   The patient was advised to call back or seek an in-person evaluation if the symptoms worsen or if the condition fails to improve as anticipated.   Gerre Scull, NP

## 2023-04-22 NOTE — Assessment & Plan Note (Signed)
Chronic, improving.  GAD-7 is at 10 which has improved from a 14.  Continue sertraline 25 mg daily and Wellbutrin 300 mg daily.  She is not currently doing therapy at the moment due to insurance.  Discussed that her work does offer some free counseling through talk space.  Follow-up in 3 months.

## 2023-04-24 ENCOUNTER — Other Ambulatory Visit (HOSPITAL_COMMUNITY): Payer: Self-pay

## 2023-04-24 ENCOUNTER — Ambulatory Visit: Payer: 59 | Admitting: Psychology

## 2023-04-28 ENCOUNTER — Other Ambulatory Visit (HOSPITAL_COMMUNITY): Payer: Self-pay

## 2023-04-29 DIAGNOSIS — H5213 Myopia, bilateral: Secondary | ICD-10-CM | POA: Diagnosis not present

## 2023-05-04 ENCOUNTER — Other Ambulatory Visit (HOSPITAL_COMMUNITY): Payer: Self-pay

## 2023-05-06 ENCOUNTER — Other Ambulatory Visit (HOSPITAL_COMMUNITY): Payer: Self-pay

## 2023-05-06 ENCOUNTER — Telehealth: Payer: Self-pay | Admitting: Pharmacy Technician

## 2023-05-06 NOTE — Telephone Encounter (Signed)
 Pharmacy Patient Advocate Encounter   Received notification from Onbase that prior authorization for UBRELVY 100MG  TABLETS is required/requested.   Insurance verification completed.   The patient is insured through [facility] .   Per test claim: PA required; PA submitted to above mentioned insurance via CoverMyMeds Key/confirmation #/EOC [id number] Status is pending

## 2023-05-07 NOTE — Telephone Encounter (Signed)
I called patient and left voicemail for patient to return call

## 2023-05-07 NOTE — Telephone Encounter (Signed)
 Pharmacy Patient Advocate Encounter  Received notification from [facility] that Prior Authorization for UBRELVY 100MG  TABLETS has been DENIED.  Full denial letter will be uploaded to the media tab. See denial reason below.   PA #/Case ID/Reference #: [id number]

## 2023-05-07 NOTE — Telephone Encounter (Signed)
 Pt states she has only tried [place] for [employer], her answer was no when asked about the other 2 medications

## 2023-05-08 ENCOUNTER — Other Ambulatory Visit: Payer: 59

## 2023-05-08 ENCOUNTER — Ambulatory Visit: Payer: 59 | Admitting: Hematology and Oncology

## 2023-05-08 ENCOUNTER — Other Ambulatory Visit (HOSPITAL_COMMUNITY): Payer: Self-pay

## 2023-05-08 ENCOUNTER — Ambulatory Visit: Payer: 59 | Admitting: Psychology

## 2023-05-08 MED ORDER — SUMATRIPTAN SUCCINATE 50 MG PO TABS
50.0000 mg | ORAL_TABLET | ORAL | 0 refills | Status: DC | PRN
  Filled 2023-05-08 – 2023-06-10 (×2): qty 9, 15d supply, fill #0

## 2023-05-08 NOTE — Addendum Note (Signed)
 Addended by: Rodman Pickle A on: 05/08/2023 10:21 AM   Modules accepted: Orders

## 2023-05-11 ENCOUNTER — Other Ambulatory Visit: Payer: Self-pay | Admitting: Physician Assistant

## 2023-05-11 DIAGNOSIS — Z86711 Personal history of pulmonary embolism: Secondary | ICD-10-CM

## 2023-05-12 NOTE — Telephone Encounter (Signed)
 I called and spoke with patient and notified her of below message.

## 2023-05-13 ENCOUNTER — Inpatient Hospital Stay: Payer: 59 | Admitting: Physician Assistant

## 2023-05-13 ENCOUNTER — Inpatient Hospital Stay: Payer: 59 | Attending: Physician Assistant

## 2023-05-19 ENCOUNTER — Other Ambulatory Visit (HOSPITAL_COMMUNITY): Payer: Self-pay

## 2023-05-22 ENCOUNTER — Ambulatory Visit: Payer: 59 | Admitting: Psychology

## 2023-05-27 ENCOUNTER — Encounter: Payer: Self-pay | Admitting: Hematology and Oncology

## 2023-05-27 ENCOUNTER — Other Ambulatory Visit: Payer: Self-pay | Admitting: Nurse Practitioner

## 2023-05-27 ENCOUNTER — Other Ambulatory Visit (HOSPITAL_COMMUNITY): Payer: Self-pay

## 2023-05-27 ENCOUNTER — Other Ambulatory Visit: Payer: Self-pay

## 2023-05-27 MED ORDER — SERTRALINE HCL 25 MG PO TABS
25.0000 mg | ORAL_TABLET | Freq: Every day | ORAL | 0 refills | Status: DC
  Filled 2023-05-27 – 2023-06-10 (×2): qty 90, 90d supply, fill #0

## 2023-05-27 NOTE — Telephone Encounter (Signed)
 Requesting: sertraline (ZOLOFT) 25 MG tablet  Last Visit: 02/06/2023 Next Visit: 06/22/2023 Last Refill: 02/18/2023  Please Advise

## 2023-05-29 ENCOUNTER — Ambulatory Visit: Admitting: Nurse Practitioner

## 2023-05-29 ENCOUNTER — Encounter: Payer: Self-pay | Admitting: Nurse Practitioner

## 2023-05-29 ENCOUNTER — Telehealth: Payer: Self-pay | Admitting: Hematology and Oncology

## 2023-05-29 VITALS — BP 116/84 | HR 123 | Temp 97.5°F | Ht 66.0 in | Wt >= 6400 oz

## 2023-05-29 DIAGNOSIS — R000 Tachycardia, unspecified: Secondary | ICD-10-CM | POA: Diagnosis not present

## 2023-05-29 DIAGNOSIS — F320 Major depressive disorder, single episode, mild: Secondary | ICD-10-CM

## 2023-05-29 DIAGNOSIS — R5383 Other fatigue: Secondary | ICD-10-CM | POA: Insufficient documentation

## 2023-05-29 NOTE — Telephone Encounter (Signed)
 Called the patient back from a MyChart message. Rescheduled appointments and the patient is aware of the changes made.

## 2023-05-29 NOTE — Assessment & Plan Note (Signed)
 Symptoms and heart rate changes upon standing suggest POTS. Refer to cardiology for evaluation and potential POTS diagnosis. Advise wearing compression socks to aid venous return and encourage adequate fluid intake.

## 2023-05-29 NOTE — Assessment & Plan Note (Signed)
 Chronic, stable.   Will continue sertraline 25 mg daily and Wellbutrin to 300 mg daily. Follow-up in 3 months.

## 2023-05-29 NOTE — Assessment & Plan Note (Signed)
 Increased fatigue may be related to Wellbutrin dosage increase or potential sleep apnea. Refer to sleep medicine for a home sleep study.

## 2023-05-29 NOTE — Patient Instructions (Signed)
 It was great to see you!  Keep drinking plenty of water  Start wearing compression socks during the day  I have placed a referral to sleep medicine and cardiology  Let's follow-up in 3 months, sooner if you have concerns.  If a referral was placed today, you will be contacted for an appointment. Please note that routine referrals can sometimes take up to 3-4 weeks to process. Please call our office if you haven't heard anything after this time frame.  Take care,  Rodman Pickle, NP

## 2023-05-29 NOTE — Progress Notes (Signed)
 Established Patient Office Visit  Subjective   Patient ID: Debra Walters, female    DOB: Jan 24, 1991  Age: 33 y.o. MRN: [id number]  Chief Complaint  Patient presents with   Discuss POTS    Wants to discuss POTS    HPI  Discussed the use of AI scribe software for clinical note transcription with the patient, who gave verbal consent to proceed.  History of Present Illness   The patient presents with a constellation of symptoms that she believes may be indicative of Postural Orthostatic Tachycardia Syndrome (POTS). She reports constant nausea, excessive sleepiness, occasional vomiting, and fainting spells. The fainting spells have occurred a few times over the past few years, often associated with heat exposure or prolonged standing. She also reports feeling overheated and nauseous during long showers. She has noticed occasional dizziness or lightheadedness when transitioning from sitting to standing or upon waking in the morning. She also reports episodes of tachycardia, mostly during the day.  The patient's symptoms have been ongoing for some time, but she has noticed an increase in sleepiness since the recent increase in her Wellbutrin dosage for anxiety and attention issues. She denies any chest pain. She has a history of anxiety and depression, which she reports are better controlled since the medication adjustment. She also reports drinking plenty of water daily.        ROS See pertinent positives and negatives per HPI.    Objective:     BP 116/84 (Patient Position: Standing, Cuff Size: Large)   Pulse (!) 123   Temp (!) 97.5 F (36.4 C)   Ht 5\' 6"  (1.676 m)   Wt (!) 456 lb 9.6 oz (207.1 kg)   SpO2 97%   BMI 73.70 kg/m  BP Readings from Last 3 Encounters:  05/29/23 116/84  02/06/23 118/84  12/24/22 132/86   Wt Readings from Last 3 Encounters:  05/29/23 (!) 456 lb 9.6 oz (207.1 kg)  02/06/23 (!) 461 lb 12.8 oz (209.5 kg)  12/24/22 (!) 452 lb 3.2 oz (205.1 kg)      Orthostatic VS for the past 72 hrs (Last 3 readings):  Orthostatic BP Patient Position BP Location Cuff Size Orthostatic Pulse  05/29/23 1524 -- Standing -- Large --  05/29/23 1523 118/88 Sitting -- Large 110  05/29/23 1522 118/82 Supine -- Large 105  05/29/23 1521 132/86 Standing Left Wrist Large 124  05/29/23 1520 (!) 120/94 Sitting -- Large 107  05/29/23 1519 (!) 122/94 Supine -- Large 108  05/29/23 1459 -- Sitting Left Arm Large --     Physical Exam Vitals and nursing note reviewed.  Constitutional:      General: She is not in acute distress.    Appearance: Normal appearance.  HENT:     Head: Normocephalic.  Eyes:     Conjunctiva/sclera: Conjunctivae normal.  Cardiovascular:     Rate and Rhythm: Normal rate and regular rhythm.     Pulses: Normal pulses.     Heart sounds: Normal heart sounds.  Pulmonary:     Effort: Pulmonary effort is normal.     Breath sounds: Normal breath sounds.  Musculoskeletal:     Cervical back: Normal range of motion.  Skin:    General: Skin is warm.  Neurological:     General: No focal deficit present.     Mental Status: She is alert and oriented to person, place, and time.  Psychiatric:        Mood and Affect: Mood normal.  Behavior: Behavior normal.        Thought Content: Thought content normal.        Judgment: Judgment normal.      Assessment & Plan:   Problem List Items Addressed This Visit       Other   Depression, major, single episode, mild (HCC)   Chronic, stable.   Will continue sertraline 25 mg daily and Wellbutrin to 300 mg daily. Follow-up in 3 months.      Fatigue   Increased fatigue may be related to Wellbutrin dosage increase or potential sleep apnea. Refer to sleep medicine for a home sleep study.      Relevant Orders   Ambulatory referral to Sleep Studies   Sinus tachycardia - Primary   Symptoms and heart rate changes upon standing suggest POTS. Refer to cardiology for evaluation and potential POTS  diagnosis. Advise wearing compression socks to aid venous return and encourage adequate fluid intake.      Relevant Orders   Ambulatory referral to Cardiology   Return in about 3 months (around 08/29/2023) for CPE.    Gerre Scull, NP

## 2023-06-02 ENCOUNTER — Other Ambulatory Visit: Payer: Self-pay | Admitting: Physician Assistant

## 2023-06-03 ENCOUNTER — Other Ambulatory Visit

## 2023-06-03 ENCOUNTER — Telehealth: Payer: Self-pay | Admitting: Physician Assistant

## 2023-06-03 ENCOUNTER — Ambulatory Visit: Admitting: Physician Assistant

## 2023-06-08 ENCOUNTER — Other Ambulatory Visit (HOSPITAL_COMMUNITY): Payer: Self-pay

## 2023-06-09 ENCOUNTER — Inpatient Hospital Stay (HOSPITAL_BASED_OUTPATIENT_CLINIC_OR_DEPARTMENT_OTHER): Admitting: Hematology and Oncology

## 2023-06-09 ENCOUNTER — Inpatient Hospital Stay: Attending: Physician Assistant

## 2023-06-09 VITALS — BP 156/87 | HR 74 | Temp 97.4°F | Resp 17 | Wt >= 6400 oz

## 2023-06-09 DIAGNOSIS — Z808 Family history of malignant neoplasm of other organs or systems: Secondary | ICD-10-CM | POA: Diagnosis not present

## 2023-06-09 DIAGNOSIS — Z7901 Long term (current) use of anticoagulants: Secondary | ICD-10-CM | POA: Insufficient documentation

## 2023-06-09 DIAGNOSIS — Z803 Family history of malignant neoplasm of breast: Secondary | ICD-10-CM | POA: Insufficient documentation

## 2023-06-09 DIAGNOSIS — Z86711 Personal history of pulmonary embolism: Secondary | ICD-10-CM | POA: Diagnosis not present

## 2023-06-09 LAB — CMP (CANCER CENTER ONLY)
ALT: 13 U/L (ref 0–44)
AST: 11 U/L — ABNORMAL LOW (ref 15–41)
Albumin: 3.8 g/dL (ref 3.5–5.0)
Alkaline Phosphatase: 92 U/L (ref 38–126)
Anion gap: 5 (ref 5–15)
BUN: 11 mg/dL (ref 6–20)
CO2: 29 mmol/L (ref 22–32)
Calcium: 9.3 mg/dL (ref 8.9–10.3)
Chloride: 106 mmol/L (ref 98–111)
Creatinine: 0.93 mg/dL (ref 0.44–1.00)
GFR, Estimated: >60 mL/min (ref >60)
Glucose, Bld: 93 mg/dL (ref 70–99)
Potassium: 4.1 mmol/L (ref 3.5–5.1)
Sodium: 140 mmol/L (ref 135–145)
Total Bilirubin: 0.4 mg/dL (ref 0.0–1.2)
Total Protein: 7.5 g/dL (ref 6.5–8.1)

## 2023-06-09 LAB — CBC WITH DIFFERENTIAL (CANCER CENTER ONLY)
Abs Immature Granulocytes: 0.02 10*3/uL (ref 0.00–0.07)
Basophils Absolute: 0.1 10*3/uL (ref 0.0–0.1)
Basophils Relative: 1 %
Eosinophils Absolute: 0.3 10*3/uL (ref 0.0–0.5)
Eosinophils Relative: 3 %
HCT: 40 % (ref 36.0–46.0)
Hemoglobin: 12.9 g/dL (ref 12.0–15.0)
Immature Granulocytes: 0 %
Lymphocytes Relative: 27 %
Lymphs Abs: 2.2 10*3/uL (ref 0.7–4.0)
MCH: 29 pg (ref 26.0–34.0)
MCHC: 32.3 g/dL (ref 30.0–36.0)
MCV: 89.9 fL (ref 80.0–100.0)
Monocytes Absolute: 0.3 10*3/uL (ref 0.1–1.0)
Monocytes Relative: 4 %
Neutro Abs: 5.2 10*3/uL (ref 1.7–7.7)
Neutrophils Relative %: 65 %
Platelet Count: 336 10*3/uL (ref 150–400)
RBC: 4.45 MIL/uL (ref 3.87–5.11)
RDW: 13.3 % (ref 11.5–15.5)
WBC Count: 8.1 10*3/uL (ref 4.0–10.5)
nRBC: 0 % (ref 0.0–0.2)

## 2023-06-09 NOTE — Progress Notes (Signed)
 [facility] [phone]   [phone]  PROGRESS NOTE  Patient Care Team: Gerre Scull, NP as PCP - General (Internal Medicine)  Hematological/Oncological History # Unprovoked Bilateral Pulmonary Emboli 06/06/2020: presented with chest pain and shortness of breath to [facility]. CT angio showed acute pulmonary emboli involving all lobes of the lungs. Overall clot burden is moderate to large.  Lower extremity Dopplers negative. Started on Xarelto therapy.  04/22/2021: establish care with Dr. Leonides Schanz   Interval History:  Debra Walters 33 y.o. female with medical history significant for bilateral pulmonary emboli who presents for a follow up visit. The patient's last visit was on 04/25/2022. In the interim since the last visit she has had no major changes in her health.  On exam today [name] reports she has been well overall in the room since her last visit.  She notes that she has had no ER visits or hospitalizations, but did begin taking Imitrex for migraines.  She reports that her energy levels have been normal but lately she has been feeling really sleepy though it has not affected her ability to work or do her day-to-day activities.  She reports she is taking her Xarelto 20 mg p.o. daily faithfully with no bleeding, bruising, or dark stools.  She reports that she has an IUD in place and does not have any bleeding with her menstrual cycles but does still have the other symptoms of a cycle.  She reports the medication is costing her $10-$15 per month.  She reports that she is not having any signs or symptoms concerning for recurrent VTE such as chest pain, leg swelling, leg pain.  She reports that she has not had any viral illnesses such as flu, COVID, or norovirus.  She notes that she does get pretty stressed on Tuesdays and did not take her blood pressure medication yet this morning. Overall she is willing and able to continue on Xarelto therapy at  this time.  She currently denies any fevers, chills, sweats, nausea, vomiting or diarrhea.  A full 10 point ROS is listed below.  MEDICAL HISTORY:  Past Medical History:  Diagnosis Date   Asthma    GERD (gastroesophageal reflux disease)    Hypertension    Pulmonary embolism (HCC) 06/01/2020    SURGICAL HISTORY: No past surgical history on file.  SOCIAL HISTORY: Social History   Socioeconomic History   Marital status: Single    Spouse name: Not on file   Number of children: Not on file   Years of education: Not on file   Highest education level: Some college, no degree  Occupational History   Not on file  Tobacco Use   Smoking status: Never   Smokeless tobacco: Never  Vaping Use   Vaping status: Never Used  Substance and Sexual Activity   Alcohol use: Yes    Alcohol/week: 1.0 standard drink of alcohol    Types: 1 drink(s) per week    Comment: monthly   Drug use: No   Sexual activity: Not on file    Comment: Kyleena placed 2022  Other Topics Concern   Not on file  Social History Narrative   Not on file   Social Drivers of Health   Financial Resource Strain: Medium Risk (05/28/2023)   Overall Financial Resource Strain (CARDIA)    Difficulty of Paying Living Expenses: Somewhat hard  Food Insecurity: No Food Insecurity (05/28/2023)   Hunger Vital Sign    Worried About Running Out of Food  in the Last Year: Never true    Ran Out of Food in the Last Year: Never true  Transportation Needs: No Transportation Needs (05/28/2023)   PRAPARE - Administrator, Civil Service (Medical): No    Lack of Transportation (Non-Medical): No  Physical Activity: Unknown (05/28/2023)   Exercise Vital Sign    Days of Exercise per Week: 0 days    Minutes of Exercise per Session: Not on file  Stress: Stress Concern Present (05/28/2023)   [employer] of Occupational Health - Occupational Stress Questionnaire    Feeling of Stress : To some extent  Social Connections:  Moderately Isolated (05/28/2023)   Social Connection and Isolation Panel [NHANES]    Frequency of Communication with Friends and Family: More than three times a week    Frequency of Social Gatherings with Friends and Family: Once a week    Attends Religious Services: 1 to 4 times per year    Active Member of [employer] or Organizations: No    Attends Engineer, structural: Not on file    Marital Status: Never married  Intimate Partner Violence: Unknown (06/07/2021)   Received from [employer], [facility]   HITS    Physically Hurt: Not on file    Insult or Talk Down To: Not on file    Threaten Physical Harm: Not on file    Scream or Curse: Not on file    FAMILY HISTORY: Family History  Problem Relation Age of Onset   Hyperlipidemia Mother    Diabetes Mother    Hypertension Mother    Drug abuse Mother    Obesity Mother    Drug abuse Father    Heart disease Father    Hypertension Father    Diabetes Maternal Grandmother    Cancer Maternal Grandmother        breast   Hypertension Maternal Grandmother    Hypertension Maternal Grandfather    Hypertension Paternal Grandmother    Obesity Paternal Grandfather    Hypertension Paternal Grandfather    Cancer Maternal Aunt        melanoma    Asthma Other    Hyperlipidemia Other    Hypertension Other    Stroke Other    Heart disease Other    Obesity Other     ALLERGIES:  is allergic to sulfa antibiotics and misc. sulfonamide containing compounds.  MEDICATIONS:  Current Outpatient Medications  Medication Sig Dispense Refill   albuterol (VENTOLIN HFA) 108 (90 Base) MCG/ACT inhaler Inhale 1 - 2 puffs into the lungs every 6 hours as needed for shortness of breath or wheezing. 6.7 g 3   buPROPion (WELLBUTRIN XL) 300 MG 24 hr tablet Take 1 tablet (300 mg total) by mouth daily. 90 tablet 0   cetirizine (ZYRTEC) 10 MG tablet Take 1 tablet (10 mg total) by mouth daily. 90 tablet 3   famotidine (PEPCID) 20 MG tablet Take by mouth.      fluticasone-salmeterol (ADVAIR DISKUS) 100-50 MCG/ACT AEPB Inhale 1 puff into the lungs 2 times daily. 60 each 3   hydrochlorothiazide (HYDRODIURIL) 25 MG tablet Take 1 tablet (25 mg total) by mouth daily. 90 tablet 0   hydrOXYzine (ATARAX) 10 MG tablet Take 1 tablet (10 mg total) by mouth 3 (three) times daily as needed. 30 tablet 1   levonorgestrel (KYLEENA) 19.5 MG IUD by Intrauterine route.     ondansetron (ZOFRAN) 4 MG tablet Take 1 tablet (4 mg) by mouth every 8 hours as needed  for nausea or vomiting. 20 tablet 0   pantoprazole (PROTONIX) 40 MG tablet Take 1 tablet (40 mg total) by mouth daily. 90 tablet 1   rivaroxaban (XARELTO) 20 MG TABS tablet Take 1 tablet (20 mg total) by mouth daily with supper. 30 tablet 2   sertraline (ZOLOFT) 25 MG tablet Take 1 tablet (25 mg total) by mouth daily. 90 tablet 0   SUMAtriptan (IMITREX) 50 MG tablet Take 1 tablet (50 mg total) by mouth every 2 (two) hours as needed for migraine. May repeat in 2 hours if headache persists or recurs. 9 tablet 0   triamcinolone cream (KENALOG) 0.1 % Apply  topically 2 times daily. 30 g 0   No current facility-administered medications for this visit.    REVIEW OF SYSTEMS:   Constitutional: ( - ) fevers, ( - )  chills , ( - ) night sweats Eyes: ( - ) blurriness of vision, ( - ) double vision, ( - ) watery eyes Ears, nose, mouth, throat, and face: ( - ) mucositis, ( - ) sore throat Respiratory: ( - ) cough, ( - ) dyspnea, ( - ) wheezes Cardiovascular: ( - ) palpitation, ( - ) chest discomfort, ( - ) lower extremity swelling Gastrointestinal:  ( - ) nausea, ( - ) heartburn, ( - ) change in bowel habits Skin: ( - ) abnormal skin rashes Lymphatics: ( - ) new lymphadenopathy, ( - ) easy bruising Neurological: ( - ) numbness, ( - ) tingling, ( - ) new weaknesses Behavioral/Psych: ( - ) mood change, ( - ) new changes  All other systems were reviewed with the patient and are negative.  PHYSICAL EXAMINATION:  Vitals:    06/09/23 1020  BP: (!) 156/87  Pulse: 74  Resp: 17  Temp: (!) 97.4 F (36.3 C)  SpO2: 99%   Filed Weights   06/09/23 1020  Weight: (!) 455 lb 8 oz (206.6 kg)    GENERAL: Well-appearing obese African-American female, alert, no distress and comfortable SKIN: skin color, texture, turgor are normal, no rashes or significant lesions EYES: conjunctiva are pink and non-injected, sclera clear LUNGS: clear to auscultation and percussion with normal breathing effort HEART: regular rate & rhythm and no murmurs and no lower extremity edema Musculoskeletal: no cyanosis of digits and no clubbing  PSYCH: alert & oriented x 3, fluent speech NEURO: no focal motor/sensory deficits  LABORATORY DATA:  I have reviewed the data as listed    Latest Ref Rng & Units 06/09/2023    9:53 AM 11/07/2022   12:38 PM 04/25/2022    8:52 AM  CBC  WBC 4.0 - 10.5 K/uL 8.1  7.6  9.1   Hemoglobin 12.0 - 15.0 g/dL 56.2  13.0  86.5   Hematocrit 36.0 - 46.0 % 40.0  40.0  39.8   Platelets 150 - 400 K/uL 336  346  400        Latest Ref Rng & Units 06/09/2023    9:53 AM 11/07/2022   12:38 PM 04/25/2022    8:52 AM  CMP  Glucose 70 - 99 mg/dL 93  95  97   BUN 6 - 20 mg/dL 11  9  13    Creatinine 0.44 - 1.00 mg/dL 7.84  6.96  2.95   Sodium 135 - 145 mmol/L 140  137  140   Potassium 3.5 - 5.1 mmol/L 4.1  4.3  4.1   Chloride 98 - 111 mmol/L 106  102  105   CO2  22 - 32 mmol/L 29  29  31    Calcium 8.9 - 10.3 mg/dL 9.3  9.7  8.8   Total Protein 6.5 - 8.1 g/dL 7.5  8.0  7.3   Total Bilirubin 0.0 - 1.2 mg/dL 0.4  0.6  0.4   Alkaline Phos 38 - 126 U/L 92  87  84   AST 15 - 41 U/L 11  17  15    ALT 0 - 44 U/L 13  21  22      RADIOGRAPHIC STUDIES: No results found.  ASSESSMENT & PLAN Debra Walters 33 y.o. female with medical history significant for bilateral pulmonary emboli who presents for a follow up visit.   # Unprovoked Bilateral Pulmonary Emboli  -- At this time findings are most consistent with bilateral  pulmonary emboli that were unprovoked.  The patient does have morbid obesity which may have contributed, though this does not appear to be a modifiable risk factor --Full hypercoagulable studies performed on 06/06/2020 at [facility] showed no clear abnormalities.  Testing include Antithrombin III, protein C, protein S, and lupus anticoagulant testing.  Additionally factor V Leiden and prothrombin gene were tested for and no clear abnormalities were found. Plan:  --Recommend indefinite anticoagulation with Xarelto therapy.  She is currently taking 20 mg p.o. daily.  Given her obesity would not recommend decreasing down to maintenance dosing. --Labs today show white blood cell 8.1, globin 12.9, MCV 89.9, platelets 336.  Creatinine is 0.93 with normal LFTs.  Okay to continue with DOAC therapy. --Return to clinic in 6 months time to reevaluate   #Mild Anemia--resolved.  -- Hgb normalized to 12.9 today.   No orders of the defined types were placed in this encounter.   All questions were answered. The patient knows to call the clinic with any problems, questions or concerns.  A total of more than 25 minutes were spent on this encounter with face-to-face time and non-face-to-face time, including preparing to see the patient, ordering tests and/or medications, counseling the patient and coordination of care as outlined above.   Ulysees Barns, MD Department of Hematology/Oncology [facility] Phone: [phone] Pager: [phone] Email: [email or web address]  06/09/2023 10:51 AM

## 2023-06-10 ENCOUNTER — Other Ambulatory Visit (HOSPITAL_COMMUNITY): Payer: Self-pay

## 2023-06-22 ENCOUNTER — Ambulatory Visit: Admitting: Nurse Practitioner

## 2023-06-29 ENCOUNTER — Institutional Professional Consult (permissible substitution): Admitting: Neurology

## 2023-06-29 ENCOUNTER — Encounter: Payer: Self-pay | Admitting: Neurology

## 2023-07-03 ENCOUNTER — Ambulatory Visit: Payer: 59 | Admitting: Psychology

## 2023-07-08 ENCOUNTER — Ambulatory Visit (INDEPENDENT_AMBULATORY_CARE_PROVIDER_SITE_OTHER): Admitting: Nurse Practitioner

## 2023-07-08 ENCOUNTER — Encounter: Payer: Self-pay | Admitting: Nurse Practitioner

## 2023-07-08 VITALS — BP 134/78 | HR 99 | Temp 97.6°F | Ht 66.0 in | Wt >= 6400 oz

## 2023-07-08 DIAGNOSIS — E66813 Obesity, class 3: Secondary | ICD-10-CM

## 2023-07-08 DIAGNOSIS — I10 Essential (primary) hypertension: Secondary | ICD-10-CM

## 2023-07-08 DIAGNOSIS — Z6841 Body Mass Index (BMI) 40.0 and over, adult: Secondary | ICD-10-CM | POA: Diagnosis not present

## 2023-07-08 NOTE — Assessment & Plan Note (Signed)
Chronic, stable  Continue HCTZ 25 mg daily

## 2023-07-08 NOTE — Progress Notes (Signed)
 Established Patient Office Visit  Subjective   Patient ID: Debra Walters, female    DOB: February 18, 1991  Age: 33 y.o. MRN: [id number]  Chief Complaint  Patient presents with   Weight Managment    Request a referral and discuss option for weight loss surgery    HPI Discussed the use of AI scribe software for clinical note transcription with the patient, who gave verbal consent to proceed.  History of Present Illness   A 33 year old female presents for a consultation regarding weight loss surgery.  She is contemplating weight loss surgery after multiple previous attempts to start the process. A recent photograph has motivated her to reconsider, as she feels dissatisfied with her current weight and recognizes the need for assistance. She has experienced unexpected weight loss over the past few months without any lifestyle changes and is uncertain of the cause.  Her blood pressure is elevated today, which she attributes to a busy day and consuming only coffee. She does not monitor her blood pressure at home due to the lack of a cuff. 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        ROS See pertinent positives and negatives per HPI.    Objective:     BP 134/78 (BP Location: Right Wrist, Cuff Size: Large)   Pulse 99   Temp 97.6 F (36.4 C)   Ht 5\' 6"  (1.676 m)   Wt (!) 452 lb (205 kg)   SpO2 100%   BMI 72.95 kg/m  BP Readings from Last 3 Encounters:  07/08/23 134/78  06/09/23 (!) 156/87  05/29/23 116/84   Wt Readings from Last 3 Encounters:  07/08/23 (!) 452 lb (205 kg)  06/09/23 (!) 455 lb 8 oz (206.6 kg)  05/29/23 (!) 456 lb 9.6 oz (207.1 kg)      Physical Exam Vitals and nursing note reviewed.  Constitutional:      General: She is not in acute distress.    Appearance: Normal appearance. She is obese.  HENT:     Head: Normocephalic.  Eyes:     Conjunctiva/sclera: Conjunctivae normal.  Cardiovascular:     Rate  and Rhythm: Normal rate and regular rhythm.     Pulses: Normal pulses.     Heart sounds: Normal heart sounds.  Pulmonary:     Effort: Pulmonary effort is normal.     Breath sounds: Normal breath sounds.  Musculoskeletal:     Cervical back: Normal range of motion.  Skin:    General: Skin is warm.  Neurological:     General: No focal deficit present.     Mental Status: She is alert and oriented to person, place, and time.  Psychiatric:        Mood and Affect: Mood normal.        Behavior: Behavior normal.        Thought Content: Thought content normal.        Judgment: Judgment normal.      Assessment & Plan:   Problem List Items Addressed This Visit       Cardiovascular and Mediastinum   Primary hypertension - Primary   Chronic, stable. Continue HCTZ 25mg  daily.         Other   Class 3 severe obesity with serious comorbidity and body mass index (BMI) greater than or equal to 70 in adult   BMI 72.9. She is considering weight loss surgery, with a referral to [facility], after previous  weight loss attempts were unsuccessful. She has tried contrave  and was on wegovy  until insurance stopped covering it. She is not a candidate for phentermine/qsymia.       Relevant Orders   Amb Referral to Bariatric Surgery    Return if symptoms worsen or fail to improve.    Odette Benjamin, NP

## 2023-07-08 NOTE — Assessment & Plan Note (Signed)
 BMI 72.9. She is considering weight loss surgery, with a referral to [facility], after previous weight loss attempts were unsuccessful. She has tried contrave  and was on wegovy  until insurance stopped covering it. She is not a candidate for phentermine/qsymia.

## 2023-07-08 NOTE — Patient Instructions (Signed)
 It was great to see you!  I have placed a referral to bariatric surgery.   Let's follow-up at your next visit, sooner if you need anything  Take care,  Rheba Cedar, NP

## 2023-07-16 ENCOUNTER — Encounter: Payer: Self-pay | Admitting: Nurse Practitioner

## 2023-07-21 ENCOUNTER — Encounter: Payer: Self-pay | Admitting: Nurse Practitioner

## 2023-07-23 ENCOUNTER — Telehealth: Payer: Self-pay | Admitting: Neurology

## 2023-07-23 ENCOUNTER — Institutional Professional Consult (permissible substitution): Admitting: Neurology

## 2023-07-23 ENCOUNTER — Encounter: Payer: Self-pay | Admitting: Neurology

## 2023-07-23 NOTE — Telephone Encounter (Signed)
 NS for sleep consult x 2.

## 2023-07-28 ENCOUNTER — Encounter: Payer: Self-pay | Admitting: Neurology

## 2023-07-29 ENCOUNTER — Other Ambulatory Visit (HOSPITAL_COMMUNITY): Payer: Self-pay

## 2023-07-29 ENCOUNTER — Other Ambulatory Visit: Payer: Self-pay

## 2023-07-29 ENCOUNTER — Other Ambulatory Visit: Payer: Self-pay | Admitting: Nurse Practitioner

## 2023-07-29 MED ORDER — ALBUTEROL SULFATE HFA 108 (90 BASE) MCG/ACT IN AERS
1.0000 | INHALATION_SPRAY | Freq: Four times a day (QID) | RESPIRATORY_TRACT | 3 refills | Status: AC | PRN
  Filled 2023-07-29: qty 6.7, 20d supply, fill #0
  Filled 2023-08-12: qty 6.7, 17d supply, fill #0
  Filled 2023-10-01: qty 6.7, 17d supply, fill #1
  Filled 2024-03-08: qty 6.7, 17d supply, fill #2

## 2023-07-29 MED ORDER — SUMATRIPTAN SUCCINATE 50 MG PO TABS
50.0000 mg | ORAL_TABLET | ORAL | 3 refills | Status: AC | PRN
  Filled 2023-07-29 – 2023-11-13 (×2): qty 9, 15d supply, fill #0

## 2023-07-29 NOTE — Telephone Encounter (Signed)
 Requesting: albuterol  (VENTOLIN  HFA) 108 (90 Base) MCG/ACT inhaler , SUMAtriptan  (IMITREX ) 50 MG tablet  Last Visit: 07/08/2023 Next Visit: 09/02/2023 Last Refill: 09/23/2022, 05/08/2023  Please Advise

## 2023-07-30 ENCOUNTER — Other Ambulatory Visit (HOSPITAL_COMMUNITY): Payer: Self-pay

## 2023-07-31 ENCOUNTER — Other Ambulatory Visit (HOSPITAL_COMMUNITY): Payer: Self-pay | Admitting: Surgery

## 2023-07-31 DIAGNOSIS — J45998 Other asthma: Secondary | ICD-10-CM | POA: Diagnosis not present

## 2023-07-31 DIAGNOSIS — I10 Essential (primary) hypertension: Secondary | ICD-10-CM | POA: Diagnosis not present

## 2023-07-31 DIAGNOSIS — I2699 Other pulmonary embolism without acute cor pulmonale: Secondary | ICD-10-CM | POA: Diagnosis not present

## 2023-07-31 DIAGNOSIS — Z6841 Body Mass Index (BMI) 40.0 and over, adult: Secondary | ICD-10-CM | POA: Diagnosis not present

## 2023-07-31 DIAGNOSIS — G43809 Other migraine, not intractable, without status migrainosus: Secondary | ICD-10-CM | POA: Diagnosis not present

## 2023-08-10 ENCOUNTER — Other Ambulatory Visit (HOSPITAL_COMMUNITY): Payer: Self-pay

## 2023-08-12 ENCOUNTER — Encounter: Payer: Self-pay | Admitting: Nurse Practitioner

## 2023-08-12 ENCOUNTER — Other Ambulatory Visit (HOSPITAL_COMMUNITY): Payer: Self-pay

## 2023-08-12 ENCOUNTER — Other Ambulatory Visit: Payer: Self-pay

## 2023-08-12 ENCOUNTER — Other Ambulatory Visit: Payer: Self-pay | Admitting: Nurse Practitioner

## 2023-08-12 MED ORDER — METRONIDAZOLE 500 MG PO TABS
500.0000 mg | ORAL_TABLET | Freq: Two times a day (BID) | ORAL | 0 refills | Status: AC
  Filled 2023-08-12: qty 14, 7d supply, fill #0

## 2023-08-13 ENCOUNTER — Other Ambulatory Visit (HOSPITAL_COMMUNITY): Payer: Self-pay

## 2023-08-13 MED ORDER — HYDROCHLOROTHIAZIDE 25 MG PO TABS
25.0000 mg | ORAL_TABLET | Freq: Every day | ORAL | 1 refills | Status: AC
  Filled 2023-08-13 – 2023-10-01 (×2): qty 90, 90d supply, fill #0
  Filled 2024-03-08: qty 90, 90d supply, fill #1

## 2023-08-13 NOTE — Telephone Encounter (Signed)
 Requesting: hydrochlorothiazide  (HYDRODIURIL ) 25 MG tablet  Last Visit: 07/08/2023 Next Visit: 09/02/2023 Last Refill: 07/10/2022  Please Advise

## 2023-08-19 ENCOUNTER — Ambulatory Visit (HOSPITAL_COMMUNITY): Payer: Self-pay | Admitting: Licensed Clinical Social Worker

## 2023-08-19 DIAGNOSIS — F4320 Adjustment disorder, unspecified: Secondary | ICD-10-CM

## 2023-08-21 ENCOUNTER — Ambulatory Visit (HOSPITAL_COMMUNITY)
Admission: RE | Admit: 2023-08-21 | Discharge: 2023-08-21 | Disposition: A | Discharge status: Home / Self Care | Source: Ambulatory Visit | Attending: Surgery | Admitting: Surgery

## 2023-08-21 ENCOUNTER — Other Ambulatory Visit: Payer: Self-pay

## 2023-08-21 ENCOUNTER — Encounter (HOSPITAL_COMMUNITY)
Admission: RE | Admit: 2023-08-21 | Discharge: 2023-08-21 | Disposition: A | Discharge status: Home / Self Care | Source: Ambulatory Visit | Attending: Surgery | Admitting: Surgery

## 2023-08-21 DIAGNOSIS — Z6841 Body Mass Index (BMI) 40.0 and over, adult: Secondary | ICD-10-CM | POA: Diagnosis not present

## 2023-08-21 DIAGNOSIS — K219 Gastro-esophageal reflux disease without esophagitis: Secondary | ICD-10-CM | POA: Diagnosis not present

## 2023-08-21 DIAGNOSIS — E66813 Obesity, class 3: Secondary | ICD-10-CM

## 2023-08-21 DIAGNOSIS — K224 Dyskinesia of esophagus: Secondary | ICD-10-CM | POA: Diagnosis not present

## 2023-08-21 DIAGNOSIS — K449 Diaphragmatic hernia without obstruction or gangrene: Secondary | ICD-10-CM | POA: Diagnosis not present
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
## 2023-08-24 ENCOUNTER — Other Ambulatory Visit (HOSPITAL_COMMUNITY): Payer: Self-pay

## 2023-08-25 ENCOUNTER — Encounter: Payer: Self-pay | Admitting: Hematology and Oncology

## 2023-08-27 ENCOUNTER — Encounter: Payer: Self-pay | Admitting: Skilled Nursing Facility1

## 2023-08-27 ENCOUNTER — Encounter: Attending: Surgery | Admitting: Skilled Nursing Facility1

## 2023-08-27 NOTE — Progress Notes (Signed)
 Nutrition Assessment for Bariatric Surgery: Pre-Surgery Behavioral and Nutrition Intervention Program   Medical Nutrition Therapy  Appt Start Time: 4:37    End Time: 6:00  Patient was seen on 08/27/2023 for Pre-Operative Nutrition Assessment. Purpose of todays visit  enhance perioperative outcomes along with a healthy weight maintenance   Referral stated Supervised Weight Loss (SWL) visits needed: 0  Not cleared at this time:  Pt to follow up for minimum of 2 more visits to assist pt with progressing through stages of change/further nutrition education. RD advised pt that this follow up visit is not mandated through insurance. Pt verbalized agreement.  Patient verbalizes understanding of the importance of addressing disordered eating behaviors prior to bariatric surgery. Patient is agreeable and motivated to continue behavioral work to improve eating habits.  Plan: Continue behavioral change and/or counseling focused on disordered eating patterns and healthy eating habits. Monitor progress closely in preoperative visits. Coordinate care with mental health professionals   Re-evaluate at each visit starting with 2 more visits as a baseline to add on per evaluation communicating with [facility] once pt is working with a therapist and better understands her relationship with food  Planned surgery: Sleeve gastrectomy  Pt expectation of surgery: to lose weight  Pt expectation of dietitian: to assist in understanding her relationship with food    NUTRITION ASSESSMENT   Anthropometrics  Start weight at [facility]: 460 lbs (date: 08/27/2023)  Height: 66 in BMI: 74.25 kg/m2     Clinical   Pharmacotherapy: History of weight loss medication used: Phentermine, GLP  Medical hx: HTN Medications: see list  Labs:   Notable signs/symptoms: nausea  Any previous deficiencies? No  Evaluation of Nutritional Deficiencies: Micronutrient Nutrition Focused Physical Exam: Hair: No issues observed Eyes: No  issues observed Mouth: No issues observed Neck: No issues observed Nails: No issues observed Skin: No issues observed  Lifestyle & Dietary Hx  Pt states her diet is inconsistent and reports binge eating episodes eating stating these occur 2+ times a week, eating to pain in her stomach, and  Pt states she has Nausea consistently needing zofran   Staff education recently promoted wants to go to school for [employer]; Starts in August  Pt states she Lives alone but has a great support system with hr family  Pt states she has Cut out soda completely  Pt states she has not gotten labs drawn yet but plans to ina bout 7 days.   Current Physical Activity Recommendations state 150 minutes per week of moderate to vigorous movement including Cardio and 1-2 days of resistance activities as well as flexibility/balance activities:  Pts current physical activity: 0, with 0% recommendation reached   Sleep Hygiene: duration and quality: throughout the night 6+ not feeling rested   Current Patient Perceived Stress Level as stated by pt on a scale of 1-10: pt stated 5+       Stress Management Techniques: TV, movies, coloring books   According to the Dietary Guidelines for Americans Recommendation: equivalent 1.5-2 cups fruits per day, equivalent 2-3 cups vegetables per day and at least half all grains whole  Fruit servings per day (on average): 1, meeting 50% recommendation  Non-starchy vegetable servings per day (on average): 1, meeting 50% recommendation  Whole Grains per day (on average): 0  Number of meals missed/skipped per week out of 21: 10+  24-Hr Dietary Recall First Meal: coffee + skipped Snack: grits + cheese Second Meal: peppers + cheese + lunch meat Snack:  Third Meal: spaghetti +  garlic bread  Snack: 2 cookie sandwiches  Beverages: water, water + flavoring   Alcoholic beverages per week: 0   Estimated Energy Needs Calories: 1800   NUTRITION DIAGNOSIS   Overweight/obesity ([place]-3.3) related to past poor dietary habits and physical inactivity as evidenced by patient w/ planned sleeve surgery following dietary guidelines for continued weight loss.    NUTRITION INTERVENTION  Nutrition counseling (C-1) and education (E-2) to facilitate bariatric surgery goals.  Educated pt on micronutrient deficiencies post-surgery and behavioral/dietary strategies to start in order to mitigate that risk   Nutrition counseling: Educated pt on the importance of consistently eating throughout the day: Sustained Energy Levels: Regular meals and snacks help maintain stable blood sugar levels, providing a steady source of energy throughout the day. This can prevent energy crashes and help you stay focused and alert. Metabolism Regulation: Eating regularly can help keep your metabolism active and functioning efficiently. Consistent meals can prevent your body from going into starvation mode, where it conserves energy and slows down metabolism. Nutrient Absorption: Spacing out meals allows your body to better absorb and utilize nutrients from food. Eating throughout the day ensures that your body receives a continuous supply of essential vitamins, minerals, and other nutrients. Appetite Control: Regular meals and snacks can help regulate appetite and prevent overeating. When you go too long without eating, you may become overly hungry and more likely to overindulge or make unhealthy food choices. Blood Sugar Regulation: Eating at regular intervals helps prevent spikes and crashes in blood sugar levels. This is particularly important for individuals with diabetes or those at risk of developing diabetes. Improved Mood and Concentration: Balanced, regular meals can positively impact your mood and cognitive function. Stable blood sugar levels support brain function, leading to better concentration, memory, and overall mental well-being. Muscle Maintenance: Consuming protein-rich  snacks or meals throughout the day can help support muscle repair and maintenance, especially for individuals who are physically active or engaged in strength training. Overall, eating throughout the day supports your body's needs for energy, nutrients, and overall health, promoting a balanced and sustainable approach to nutrition  Dietitian worked with pt on their emotional relationship with food as this is integral to the pts maintenance of a healthy lifestyle long term.  Identify Triggers: What leads one to emotionally eat? Is there a sudden onset of cravings? Is there rapid consumption?  Awareness  Explore and identify emotional triggers that lead to certain eating behaviors. These triggers could be stress, boredom, sadness, loneliness, or a plethora of other emotions.  Mindful Eating: Encouraged mindfulness during meals. This involves paying attention to the sensory experience of eating, such as the taste, texture, and smell of food. It can help increase awareness of hunger and satisfaction cues. Gave pt mindfulness exercise   Emotional Awareness: Fostered emotional awareness by helping the patient recognize and express their emotions in ways other than turning to food (moving through the emotion rather than stuffing it down with food). Encouraged journaling, talking to a therapist, or engaging in activities that provide emotional support and assist the pt in more appropriately dealing with their feelings.  Cognitive Behavioral Techniques: Introduce cognitive-behavioral techniques to challenge and change negative thought patterns related to food. Help the patient develop healthier coping mechanisms for dealing with emotions. Establish Healthy Coping Strategies: Assist the patient in finding alternative coping strategies for managing emotions without resorting to food. This could include exercise, meditation, deep breathing, or engaging in hobbies. Support System: Encourage the patient to  build a strong  support system, whether it's through friends, family, or support groups. Having a network can provide emotional support during challenging times. Self-Compassion: Foster self-compassion and encourage the patient to be kind to themselves. Building a positive self-image can contribute to healthier relationships with food. Gradual Changes: Support the patient in making gradual, sustainable changes to their eating habits. Small steps over time are more likely to lead to long-term success. Professional Counseling: Advised the patient work with a Armed forces technical officer. Professional guidance can provide additional tools and strategies for addressing emotional issues. As a dietitian I cannot offer mental health advice Nutritional Education: Provided education on the nutritional aspects of food and help the patient understand the role of food in nourishing the body rather than solely as a way to cope with emotions.  *Goals that are bolded indicate the pt would like to start working towards these  -I will find a therapist preferably one specializing in eating disorders -I will eat breakfast, lunch and dinner -I will complete the mindful meals exercise   Handouts Provided Include  Should I eat Mindful meals  Learning Style & Readiness for Change Teaching method utilized: Visual, Auditory, and hands on  Demonstrated degree of understanding via: Teach Back  Readiness Level: Contemplative  Barriers to learning/adherence to lifestyle change: Disordered eating patterns  RD's Notes for Next Visit Continue working on relationship with food and possible BED    MONITORING & EVALUATION Dietary intake, weekly physical activity, body weight, and preoperative behavioral change goals   Next Steps  Patient is to follow up at [facility] for pre surgery follow up in 2 weeks

## 2023-09-02 ENCOUNTER — Telehealth: Payer: Self-pay | Admitting: Hematology and Oncology

## 2023-09-02 ENCOUNTER — Ambulatory Visit (INDEPENDENT_AMBULATORY_CARE_PROVIDER_SITE_OTHER): Admitting: Nurse Practitioner

## 2023-09-02 VITALS — BP 130/80 | HR 84 | Temp 97.7°F | Ht 66.0 in | Wt >= 6400 oz

## 2023-09-02 DIAGNOSIS — F320 Major depressive disorder, single episode, mild: Secondary | ICD-10-CM | POA: Diagnosis not present

## 2023-09-02 DIAGNOSIS — Z0000 Encounter for general adult medical examination without abnormal findings: Secondary | ICD-10-CM

## 2023-09-02 DIAGNOSIS — Z86711 Personal history of pulmonary embolism: Secondary | ICD-10-CM

## 2023-09-02 DIAGNOSIS — Z975 Presence of (intrauterine) contraceptive device: Secondary | ICD-10-CM | POA: Diagnosis not present

## 2023-09-02 DIAGNOSIS — J4530 Mild persistent asthma, uncomplicated: Secondary | ICD-10-CM | POA: Diagnosis not present

## 2023-09-02 DIAGNOSIS — I10 Essential (primary) hypertension: Secondary | ICD-10-CM | POA: Diagnosis not present

## 2023-09-02 DIAGNOSIS — Z23 Encounter for immunization: Secondary | ICD-10-CM

## 2023-09-02 NOTE — Telephone Encounter (Signed)
 Scheduled appointments per patients request for surgical clearance. Talked with the patient and she is aware of the made appointments.

## 2023-09-02 NOTE — Progress Notes (Signed)
 BP 130/80   Pulse 84   Temp 97.7 F (36.5 C)   Ht 5' 6 (1.676 m)   Wt (!) 455 lb 6.4 oz (206.6 kg)   SpO2 97%   BMI 73.50 kg/m    Subjective:    Patient ID: Debra Walters, female    DOB: 1990-10-04, 33 y.o.   MRN: [id number]  CC: Chief Complaint  Patient presents with   Annual Exam    Physical; no immediate concerns; looking for OB     HPI: Debra Walters is Debra 33 y.o. female presenting on 09/02/2023 for comprehensive medical examination. Current medical complaints include:none  Depression and Anxiety Screen done today and results listed below:     09/02/2023    9:09 AM 08/21/2023    4:41 PM 04/22/2023    8:36 AM 02/06/2023    3:33 PM 12/24/2022    8:33 AM  Depression screen PHQ 2/9  Decreased Interest 1  1 2 1   Down, Depressed, Hopeless 1  1 2 1   PHQ - 2 Score 2  2 4 2   Altered sleeping 1  2 2 1   Tired, decreased energy 1  1 2 1   Change in appetite 1  3 2 1   Feeling bad or failure about yourself  0  2 3 1   Trouble concentrating 1  1 3 1   Moving slowly or fidgety/restless 0  0 3 1  Suicidal thoughts 0  0 0 0  PHQ-9 Score 6  11 19 8   Difficult doing work/chores Somewhat difficult   Somewhat difficult Somewhat difficult     Information is confidential and restricted. Go to Review Flowsheets to unlock data.      09/02/2023    9:10 AM 04/22/2023    8:38 AM 02/06/2023    3:33 PM 12/24/2022    8:33 AM  GAD 7 : Generalized Anxiety Score  Nervous, Anxious, on Edge 1 2 2 1   Control/stop worrying 1 1 2 1   Worry too much - different things 1 3 2 1   Trouble relaxing 0 1 2 1   Restless 1 1 2 1   Easily annoyed or irritable 0 1 2 3   Afraid - awful might happen 0 1 2 2   Total GAD 7 Score 4 10 14 10   Anxiety Difficulty Somewhat difficult  Somewhat difficult Somewhat difficult    The patient does not have Debra history of falls. I did not complete Debra risk assessment for falls. Debra plan of care for falls was not documented.   Past Medical History:  Past Medical History:   Diagnosis Date   Asthma    GERD (gastroesophageal reflux disease)    Hypertension    Morbid obesity (HCC)    Pulmonary embolism (HCC) 06/01/2020    Surgical History:  History reviewed. No pertinent surgical history.  Medications:  Current Outpatient Medications on File Prior to Visit  Medication Sig   albuterol  (VENTOLIN  HFA) 108 (90 Base) MCG/ACT inhaler Inhale 1 - 2 puffs into the lungs every 6 hours as needed for shortness of breath or wheezing.   buPROPion  (WELLBUTRIN  XL) 300 MG 24 hr tablet Take 1 tablet (300 mg total) by mouth daily.   fluticasone -salmeterol (ADVAIR  DISKUS) 100-50 MCG/ACT AEPB Inhale 1 puff into the lungs 2 times daily.   hydrochlorothiazide  (HYDRODIURIL ) 25 MG tablet Take 1 tablet (25 mg total) by mouth daily.   hydrOXYzine  (ATARAX ) 10 MG tablet Take 1 tablet (10 mg total) by mouth 3 (three) times daily as needed.  levonorgestrel (KYLEENA) 19.5 MG IUD by Intrauterine route.   Loratadine (CLARITIN) 10 MG CAPS Take 10 mg by mouth daily.   ondansetron  (ZOFRAN ) 4 MG tablet Take 1 tablet (4 mg) by mouth every 8 hours as needed for nausea or vomiting.   pantoprazole  (PROTONIX ) 40 MG tablet Take 1 tablet (40 mg total) by mouth daily.   rivaroxaban  (XARELTO ) 20 MG TABS tablet Take 1 tablet (20 mg total) by mouth daily with supper.   sertraline  (ZOLOFT ) 25 MG tablet Take 1 tablet (25 mg total) by mouth daily.   SUMAtriptan  (IMITREX ) 50 MG tablet Take 1 tablet (50 mg total) by mouth every 2 (two) hours as needed for migraine. May repeat in 2 hours if headache persists or recurs.   triamcinolone  cream (KENALOG ) 0.1 % Apply  topically 2 times daily.   No current facility-administered medications on file prior to visit.    Allergies:  Allergies  Allergen Reactions   Sulfa Antibiotics     Not sure   Misc. Sulfonamide Containing Compounds Rash    Other reaction(s): UNKNOWN Other reaction(s): UNKNOWN     Social History:  Social History   Socioeconomic History    Marital status: Single    Spouse name: Not on file   Number of children: Not on file   Years of education: Not on file   Highest education level: Some college, no degree  Occupational History   Not on file  Tobacco Use   Smoking status: Never   Smokeless tobacco: Never  Vaping Use   Vaping status: Never Used  Substance and Sexual Activity   Alcohol use: Yes    Alcohol/week: 1.0 standard drink of alcohol    Types: 1 drink(s) per week    Comment: monthly   Drug use: No   Sexual activity: Not on file    Comment: Kyleena placed 2022  Other Topics Concern   Not on file  Social History Narrative   Not on file   Social Drivers of Health   Financial Resource Strain: Medium Risk (09/02/2023)   Overall Financial Resource Strain (CARDIA)    Difficulty of Paying Living Expenses: Somewhat hard  Food Insecurity: No Food Insecurity (09/02/2023)   Hunger Vital Sign    Worried About Running Out of Food in the Last Year: Never true    Ran Out of Food in the Last Year: Never true  Transportation Needs: No Transportation Needs (09/02/2023)   PRAPARE - Administrator, Civil Service (Medical): No    Lack of Transportation (Non-Medical): No  Physical Activity: Inactive (09/02/2023)   Exercise Vital Sign    Days of Exercise per Week: 0 days    Minutes of Exercise per Session: Not on file  Stress: No Stress Concern Present (09/02/2023)   [employer] of Occupational Health - Occupational Stress Questionnaire    Feeling of Stress: Only Debra little  Social Connections: Moderately Isolated (09/02/2023)   Social Connection and Isolation Panel    Frequency of Communication with Friends and Family: More than three times Debra week    Frequency of Social Gatherings with Friends and Family: Once Debra week    Attends Religious Services: 1 to 4 times per year    Active Member of [employer] or Organizations: No    Attends Banker Meetings: Not on file    Marital Status: Never married  Intimate  Partner Violence: Unknown (06/07/2021)   Received from [facility]   HITS    Physically Hurt: Not  on file    Insult or Talk Down To: Not on file    Threaten Physical Harm: Not on file    Scream or Curse: Not on file   Social History   Tobacco Use  Smoking Status Never  Smokeless Tobacco Never   Social History   Substance and Sexual Activity  Alcohol Use Yes   Alcohol/week: 1.0 standard drink of alcohol   Types: 1 drink(s) per week   Comment: monthly    Family History:  Family History  Problem Relation Age of Onset   Hyperlipidemia Mother    Diabetes Mother    Hypertension Mother    Drug abuse Mother    Obesity Mother    Drug abuse Father    Heart disease Father    Hypertension Father    Diabetes Maternal Grandmother    Cancer Maternal Grandmother        breast   Hypertension Maternal Grandmother    Hypertension Maternal Grandfather    Hypertension Paternal Grandmother    Obesity Paternal Grandfather    Hypertension Paternal Grandfather    Cancer Maternal Aunt        melanoma    Asthma Other    Hyperlipidemia Other    Hypertension Other    Stroke Other    Heart disease Other    Obesity Other     Past medical history, surgical history, medications, allergies, family history and social history reviewed with patient today and changes made to appropriate areas of the chart.   Review of Systems  Constitutional: Negative.   HENT: Negative.    Eyes: Negative.   Respiratory: Negative.    Cardiovascular: Negative.   Gastrointestinal: Negative.   Genitourinary: Negative.   Musculoskeletal:  Positive for back pain and joint pain.  Skin: Negative.   Neurological: Negative.   Psychiatric/Behavioral: Negative.     All other ROS negative except what is listed above and in the HPI.      Objective:    BP 130/80   Pulse 84   Temp 97.7 F (36.5 C)   Ht 5' 6 (1.676 m)   Wt (!) 455 lb 6.4 oz (206.6 kg)   SpO2 97%   BMI 73.50 kg/m   Wt Readings from Last 3  Encounters:  09/02/23 (!) 455 lb 6.4 oz (206.6 kg)  08/27/23 (!) 460 lb (208.7 kg)  07/08/23 (!) 452 lb (205 kg)    Physical Exam Vitals and nursing note reviewed.  Constitutional:      General: She is not in acute distress.    Appearance: Normal appearance.  HENT:     Head: Normocephalic and atraumatic.     Right Ear: Tympanic membrane, ear canal and external ear normal.     Left Ear: Tympanic membrane, ear canal and external ear normal.  Eyes:     Conjunctiva/sclera: Conjunctivae normal.  Cardiovascular:     Rate and Rhythm: Normal rate and regular rhythm.     Pulses: Normal pulses.     Heart sounds: Normal heart sounds.  Pulmonary:     Effort: Pulmonary effort is normal.     Breath sounds: Normal breath sounds.  Abdominal:     Palpations: Abdomen is soft.     Tenderness: There is no abdominal tenderness.  Musculoskeletal:        General: Normal range of motion.     Cervical back: Normal range of motion and neck supple.     Right lower leg: No edema.     Left  lower leg: No edema.  Lymphadenopathy:     Cervical: No cervical adenopathy.  Skin:    General: Skin is warm and dry.  Neurological:     General: No focal deficit present.     Mental Status: She is alert and oriented to person, place, and time.     Cranial Nerves: No cranial nerve deficit.     Coordination: Coordination normal.     Gait: Gait normal.  Psychiatric:        Mood and Affect: Mood normal.        Behavior: Behavior normal.        Thought Content: Thought content normal.        Judgment: Judgment normal.     Results for orders placed or performed in visit on 06/09/23  CMP (Cancer Center only)   Collection Time: 06/09/23  9:53 AM  Result Value Ref Range   Sodium 140 135 - 145 mmol/L   Potassium 4.1 3.5 - 5.1 mmol/L   Chloride 106 98 - 111 mmol/L   CO2 29 22 - 32 mmol/L   Glucose, Bld 93 70 - 99 mg/dL   BUN 11 6 - 20 mg/dL   Creatinine 9.06 9.55 - 1.00 mg/dL   Calcium 9.3 8.9 - 89.6 mg/dL    Total Protein 7.5 6.5 - 8.1 g/dL   Albumin 3.8 3.5 - 5.0 g/dL   AST 11 (L) 15 - 41 U/L   ALT 13 0 - 44 U/L   Alkaline Phosphatase 92 38 - 126 U/L   Total Bilirubin 0.4 0.0 - 1.2 mg/dL   GFR, Estimated >39 >39 mL/min   Anion gap 5 5 - 15  CBC with Differential (Cancer Center Only)   Collection Time: 06/09/23  9:53 AM  Result Value Ref Range   WBC Count 8.1 4.0 - 10.5 K/uL   RBC 4.45 3.87 - 5.11 MIL/uL   Hemoglobin 12.9 12.0 - 15.0 g/dL   HCT 59.9 63.9 - 53.9 %   MCV 89.9 80.0 - 100.0 fL   MCH 29.0 26.0 - 34.0 pg   MCHC 32.3 30.0 - 36.0 g/dL   RDW 86.6 88.4 - 84.4 %   Platelet Count 336 150 - 400 K/uL   nRBC 0.0 0.0 - 0.2 %   Neutrophils Relative % 65 %   Neutro Abs 5.2 1.7 - 7.7 K/uL   Lymphocytes Relative 27 %   Lymphs Abs 2.2 0.7 - 4.0 K/uL   Monocytes Relative 4 %   Monocytes Absolute 0.3 0.1 - 1.0 K/uL   Eosinophils Relative 3 %   Eosinophils Absolute 0.3 0.0 - 0.5 K/uL   Basophils Relative 1 %   Basophils Absolute 0.1 0.0 - 0.1 K/uL   Immature Granulocytes 0 %   Abs Immature Granulocytes 0.02 0.00 - 0.07 K/uL      Assessment & Plan:   Problem List Items Addressed This Visit       Cardiovascular and Mediastinum   Primary hypertension   Chronic, stable. Continue HCTZ 25mg  daily. Recent CMP, CBC reviewed.         Respiratory   Mild persistent asthma without complication   Chronic, stable.  Symptoms are well controlled on Advair  and albuterol  as needed. Prevnar 20 given today.         Other   Morbid obesity (HCC)   BMI 73.5 She is following with the bariatric clinic and possibly getting surgery.       Depression, major, single episode, mild (HCC)  Chronic, stable.   Will continue sertraline  25 mg daily and Wellbutrin  to 300 mg daily. Follow-up in 6 months.      History of pulmonary embolism   Continue xarelto  20mg  daily and collaboration from hematology.       Routine general medical examination at [facility] facility - Primary   Health  maintenance reviewed and updated. Discussed nutrition, exercise. Follow-up 1 year.        Other Visit Diagnoses       IUD (intrauterine device) in place       Due to be changed along with needing pap smear. Referral placed to GYN.   Relevant Orders   Ambulatory referral to Gynecology     Immunization due       Prevnar 20 given today.   Relevant Orders   Pneumococcal conjugate vaccine 20-valent (Prevnar 20)        Follow up plan: Return in about 6 months (around 03/04/2024) for Anxiety, Depression.   LABORATORY TESTING:  - Pap smear: will get through GYN  IMMUNIZATIONS:   - Tdap: Tetanus vaccination status reviewed: last tetanus booster within 10 years. - Influenza: Postponed to flu season - Pneumovax: Not applicable - Prevnar: Administered today - HPV: Not applicable - Shingrix vaccine: Not applicable  SCREENING: -Mammogram: Not applicable  - Colonoscopy: Not applicable  - Bone Density: Not applicable   PATIENT COUNSELING:   Advised to take 1 mg of folate supplement per day if capable of pregnancy.   Sexuality: Discussed sexually transmitted diseases, partner selection, use of condoms, avoidance of unintended pregnancy  and contraceptive alternatives.   Advised to avoid cigarette smoking.  I discussed with the patient that most people either abstain from alcohol or drink within safe limits (<=14/week and <=4 drinks/occasion for males, <=7/weeks and <= 3 drinks/occasion for females) and that the risk for alcohol disorders and other health effects rises proportionally with the number of drinks per week and how often Debra drinker exceeds daily limits.  Discussed cessation/primary prevention of drug use and availability of treatment for abuse.   Diet: Encouraged to adjust caloric intake to maintain  or achieve ideal body weight, to reduce intake of dietary saturated fat and total fat, to limit sodium intake by avoiding high sodium foods and not adding table salt, and to  maintain adequate dietary potassium and calcium preferably from fresh fruits, vegetables, and low-fat dairy products.    stressed the importance of regular exercise  Injury prevention: Discussed safety belts, safety helmets, smoke detector, smoking near bedding or upholstery.   Dental health: Discussed importance of regular tooth brushing, flossing, and dental visits.    NEXT PREVENTATIVE PHYSICAL DUE IN 1 YEAR. Return in about 6 months (around 03/04/2024) for Anxiety, Depression.  Debra Walters Debra Walters

## 2023-09-02 NOTE — Assessment & Plan Note (Signed)
 Chronic, stable. Continue HCTZ 25mg  daily. Recent CMP, CBC reviewed.

## 2023-09-02 NOTE — Assessment & Plan Note (Signed)
 BMI 73.5 She is following with the bariatric clinic and possibly getting surgery.

## 2023-09-02 NOTE — Assessment & Plan Note (Signed)
Health maintenance reviewed and updated. Discussed nutrition, exercise. Follow-up 1 year.

## 2023-09-02 NOTE — Assessment & Plan Note (Signed)
 Chronic, stable.   Will continue sertraline  25 mg daily and Wellbutrin  to 300 mg daily. Follow-up in 6 months.

## 2023-09-02 NOTE — Patient Instructions (Signed)
 It was great to see you!  We are updating your pneumonia vaccine  Let's follow-up in 6 months, sooner if you have concerns.  If a referral was placed today, you will be contacted for an appointment. Please note that routine referrals can sometimes take up to 3-4 weeks to process. Please call our office if you haven't heard anything after this time frame.  Take care,  Tinnie Harada, NP

## 2023-09-02 NOTE — Assessment & Plan Note (Signed)
 Continue xarelto  20mg  daily and collaboration from hematology.

## 2023-09-02 NOTE — Assessment & Plan Note (Signed)
 Chronic, stable.  Symptoms are well controlled on Advair  and albuterol  as needed. Prevnar 20 given today.

## 2023-09-03 ENCOUNTER — Ambulatory Visit: Admitting: Dietician

## 2023-09-08 ENCOUNTER — Encounter: Attending: Skilled Nursing Facility1 | Admitting: Skilled Nursing Facility1

## 2023-09-08 DIAGNOSIS — Z6841 Body Mass Index (BMI) 40.0 and over, adult: Secondary | ICD-10-CM | POA: Diagnosis not present

## 2023-09-08 DIAGNOSIS — E66813 Obesity, class 3: Secondary | ICD-10-CM | POA: Insufficient documentation

## 2023-09-08 NOTE — Progress Notes (Unsigned)
 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 Mindful meals: try not to do (think about food or chaning it or why I eat so much verse nothing on others, does not end up anytiuhg posotve not able to identify emotion Reache dout to [facility] Ordered groceries and etaing breakfast and dinner Reduced binge tain events last epeisode a week ago  Estimated daily fluid intake: *** oz Supplements: *** Current average weekly physical activity: ***  24-Hr Dietary Recall First Meal: *** Snack: *** Second Meal: *** Snack: *** Third Meal: *** Snack: *** Beverages: ***  Estimated Energy Needs Calories: *** Carbohydrate: ***g Protein: ***g Fat: ***g   NUTRITION DIAGNOSIS  Overweight/obesity ([place]-3.3) related to past poor dietary habits and physical inactivity as evidenced by patient w/ planned *** surgery following dietary guidelines for continued weight loss.   NUTRITION INTERVENTION  Nutrition counseling (C-1) and education (E-2) to facilitate bariatric surgery goals.  Pre-Op Goals Progress & New Goals ***  Handouts Provided Include  ***  Learning Style & Readiness for Change Teaching method utilized: Visual & Auditory  Demonstrated degree of understanding via: Teach Back  Readiness Level: *** Barriers to learning/adherence to lifestyle change: ***  RD's Notes for next Visit  ***   MONITORING & EVALUATION Dietary intake, weekly  physical activity, body weight, and pre-op goals.  Next Steps  Patient is to return to [facility] in *** for ***.

## 2023-09-09 ENCOUNTER — Encounter: Payer: Self-pay | Admitting: Nurse Practitioner

## 2023-09-09 ENCOUNTER — Ambulatory Visit: Admitting: Nurse Practitioner

## 2023-09-09 VITALS — BP 138/92 | HR 73 | Temp 97.4°F | Ht 66.0 in | Wt >= 6400 oz

## 2023-09-09 DIAGNOSIS — T50A95A Adverse effect of other bacterial vaccines, initial encounter: Secondary | ICD-10-CM

## 2023-09-09 DIAGNOSIS — R2232 Localized swelling, mass and lump, left upper limb: Secondary | ICD-10-CM

## 2023-09-09 NOTE — Progress Notes (Signed)
   Acute Office Visit  Subjective:     Patient ID: Debra Walters, female    DOB: 13-Aug-1990, 33 y.o.   MRN: [id number]  Chief Complaint  Patient presents with   Medication Reaction    Patient had pneumonia vaccine on 09/02/23 in left deltoid, redness and rash, pain has gone    HPI Discussed the use of AI scribe software for clinical note transcription with the patient, who gave verbal consent to proceed.  History of Present Illness   Debra Walters is a 33 year old female who presents with a localized reaction to a pneumonia vaccine.  She experiences a burning sensation at the injection site immediately after the pneumonia vaccine. By Friday, the area becomes erythematous, and by Sunday, it is ecchymotic. A palpable lump is present, initially painful but now painless, with occasional itching. The area feels warm to the touch. She bruises easily due to xarelto . No systemic symptoms such as fever or illness are present. She has not applied any treatment but has triamcinolone  cream available for itching.      ROS See pertinent positives and negatives per HPI.     Objective:    BP (!) 138/92 (BP Location: Right Wrist, Patient Position: Sitting, Cuff Size: Large)   Pulse 73   Temp (!) 97.4 F (36.3 C)   Ht 5' 6 (1.676 m)   Wt (!) 462 lb (209.6 kg)   SpO2 98%   BMI 74.57 kg/m     Physical Exam Vitals and nursing note reviewed.  Constitutional:      General: She is not in acute distress.    Appearance: Normal appearance.  HENT:     Head: Normocephalic.  Eyes:     Conjunctiva/sclera: Conjunctivae normal.  Pulmonary:     Effort: Pulmonary effort is normal.  Musculoskeletal:     Cervical back: Normal range of motion.  Skin:    General: Skin is warm.     Findings: Bruising (left deltoid) present.     Comments: Pea-sized knot palpated in left upper arm  Neurological:     General: No focal deficit present.     Mental Status: She is alert and oriented to person, place,  and time.  Psychiatric:        Mood and Affect: Mood normal.        Behavior: Behavior normal.        Thought Content: Thought content normal.        Judgment: Judgment normal.       Assessment & Plan:   Localized reaction to pneumococcal vaccine   She has a localized reaction with burning, bruising, warmth, itching, and a lump at the injection site, without systemic involvement. Apply warm compresses to alleviate symptoms and use triamcinolone  cream as needed for itching. Monitor for signs of worsening or infection.    No orders of the defined types were placed in this encounter.   Return if symptoms worsen or fail to improve.  Tinnie DELENA Harada, NP

## 2023-09-09 NOTE — Patient Instructions (Signed)
 It was great to see you!  Start warm compresses throughout the day  Start triamcinolone  cream as needed for itching   Let's follow-up if your symptoms worsen or don't improve   Take care,  Tinnie Harada, NP

## 2023-09-15 ENCOUNTER — Other Ambulatory Visit: Payer: Self-pay | Admitting: Hematology and Oncology

## 2023-09-15 ENCOUNTER — Inpatient Hospital Stay: Attending: Hematology and Oncology | Admitting: Hematology and Oncology

## 2023-09-15 ENCOUNTER — Inpatient Hospital Stay

## 2023-09-15 ENCOUNTER — Other Ambulatory Visit (HOSPITAL_COMMUNITY): Payer: Self-pay

## 2023-09-15 VITALS — BP 137/92 | HR 96 | Temp 97.9°F | Resp 17 | Wt >= 6400 oz

## 2023-09-15 DIAGNOSIS — Z7901 Long term (current) use of anticoagulants: Secondary | ICD-10-CM | POA: Diagnosis not present

## 2023-09-15 DIAGNOSIS — Z86711 Personal history of pulmonary embolism: Secondary | ICD-10-CM

## 2023-09-15 LAB — CMP (CANCER CENTER ONLY)
ALT: 15 U/L (ref 0–44)
AST: 15 U/L (ref 15–41)
Albumin: 3.7 g/dL (ref 3.5–5.0)
Alkaline Phosphatase: 87 U/L (ref 38–126)
Anion gap: 6 (ref 5–15)
BUN: 9 mg/dL (ref 6–20)
CO2: 28 mmol/L (ref 22–32)
Calcium: 9.2 mg/dL (ref 8.9–10.3)
Chloride: 105 mmol/L (ref 98–111)
Creatinine: 0.85 mg/dL (ref 0.44–1.00)
GFR, Estimated: >60 mL/min (ref >60)
Glucose, Bld: 98 mg/dL (ref 70–99)
Potassium: 4.3 mmol/L (ref 3.5–5.1)
Sodium: 139 mmol/L (ref 135–145)
Total Bilirubin: 0.6 mg/dL (ref 0.0–1.2)
Total Protein: 7.1 g/dL (ref 6.5–8.1)

## 2023-09-15 LAB — CBC WITH DIFFERENTIAL (CANCER CENTER ONLY)
Abs Immature Granulocytes: 0.02 K/uL (ref 0.00–0.07)
Basophils Absolute: 0 K/uL (ref 0.0–0.1)
Basophils Relative: 1 %
Eosinophils Absolute: 0.2 K/uL (ref 0.0–0.5)
Eosinophils Relative: 2 %
HCT: 39.2 % (ref 36.0–46.0)
Hemoglobin: 13.1 g/dL (ref 12.0–15.0)
Immature Granulocytes: 0 %
Lymphocytes Relative: 25 %
Lymphs Abs: 2 K/uL (ref 0.7–4.0)
MCH: 29.9 pg (ref 26.0–34.0)
MCHC: 33.4 g/dL (ref 30.0–36.0)
MCV: 89.5 fL (ref 80.0–100.0)
Monocytes Absolute: 0.3 K/uL (ref 0.1–1.0)
Monocytes Relative: 4 %
Neutro Abs: 5.5 K/uL (ref 1.7–7.7)
Neutrophils Relative %: 68 %
Platelet Count: 316 K/uL (ref 150–400)
RBC: 4.38 MIL/uL (ref 3.87–5.11)
RDW: 13.4 % (ref 11.5–15.5)
WBC Count: 8 K/uL (ref 4.0–10.5)
nRBC: 0 % (ref 0.0–0.2)

## 2023-09-15 MED ORDER — APIXABAN 5 MG PO TABS
5.0000 mg | ORAL_TABLET | Freq: Two times a day (BID) | ORAL | 6 refills | Status: AC
  Filled 2023-09-15 – 2023-11-13 (×3): qty 60, 30d supply, fill #0
  Filled 2024-03-08: qty 60, 30d supply, fill #1

## 2023-09-15 NOTE — Progress Notes (Signed)
 [facility] [phone]   [phone]  PROGRESS NOTE  Patient Care Team: Nedra Tinnie LABOR, NP as PCP - General (Internal Medicine)  Hematological/Oncological History # Unprovoked Bilateral Pulmonary Emboli 06/06/2020: presented with chest pain and shortness of breath to [facility]. CT angio showed acute pulmonary emboli involving all lobes of the lungs. Overall clot burden is moderate to large.  Lower extremity Dopplers negative. Started on Xarelto  therapy.  04/22/2021: establish care with Dr. Federico   Interval History:  Debra Walters 33 y.o. female with medical history significant for bilateral pulmonary emboli who presents for a follow up visit. The patient's last visit was on 06/09/2023. In the interim since the last visit she has had no major changes in her health.  She comes in today for hematology clearance prior to a gastric sleeve procedure.   On exam today Debra Walters reports she has been well overall in the interim since our last visit.  She reports that she does not currently have a date scheduled for her gastric sleeve surgery.  She reports that she is taking Xarelto  and tolerating it well with no bleeding, bruising, or dark stools.  She has no signs or symptoms concerning for recurrent clot such as leg pain, leg swelling, chest pain, or shortness of breath.  She reports Xarelto  is costing her $10 - $25 per month.  She reports that she does have an IUD in place and her menstrual cycles are not particularly heavy.  She otherwise denies any fevers, chills, sweats, nausea, vomiting or diarrhea.  A full 10 point ROS is otherwise negative.  The bulk of our discussion focused on next steps.  We discussed holding Xarelto  therapy to 48 hours prior to surgery.  Additionally we discussed that Xarelto  is predominantly absorbed in the stomach and therefore we recommend transition to Eliquis .  We also discussed expected long-term nutritional deficiencies as a  result of this procedure.  She voiced understanding of our findings and recommendations moving forward.  MEDICAL HISTORY:  Past Medical History:  Diagnosis Date   Asthma    GERD (gastroesophageal reflux disease)    Hypertension    Morbid obesity (HCC)    Pulmonary embolism (HCC) 06/01/2020    SURGICAL HISTORY: No past surgical history on file.  SOCIAL HISTORY: Social History   Socioeconomic History   Marital status: Single    Spouse name: Not on file   Number of children: Not on file   Years of education: Not on file   Highest education level: Some college, no degree  Occupational History   Not on file  Tobacco Use   Smoking status: Never   Smokeless tobacco: Never  Vaping Use   Vaping status: Never Used  Substance and Sexual Activity   Alcohol use: Yes    Alcohol/week: 1.0 standard drink of alcohol    Types: 1 drink(s) per week    Comment: monthly   Drug use: No   Sexual activity: Not on file    Comment: Kyleena placed 2022  Other Topics Concern   Not on file  Social History Narrative   Not on file   Social Drivers of Health   Financial Resource Strain: Medium Risk (09/02/2023)   Overall Financial Resource Strain (CARDIA)    Difficulty of Paying Living Expenses: Somewhat hard  Food Insecurity: No Food Insecurity (09/02/2023)   Hunger Vital Sign    Worried About Running Out of Food in the Last Year: Never true    Ran Out of  Food in the Last Year: Never true  Transportation Needs: No Transportation Needs (09/02/2023)   PRAPARE - Administrator, Civil Service (Medical): No    Lack of Transportation (Non-Medical): No  Physical Activity: Inactive (09/02/2023)   Exercise Vital Sign    Days of Exercise per Week: 0 days    Minutes of Exercise per Session: Not on file  Stress: No Stress Concern Present (09/02/2023)   [employer] of Occupational Health - Occupational Stress Questionnaire    Feeling of Stress: Only a little  Social Connections: Moderately  Isolated (09/02/2023)   Social Connection and Isolation Panel    Frequency of Communication with Friends and Family: More than three times a week    Frequency of Social Gatherings with Friends and Family: Once a week    Attends Religious Services: 1 to 4 times per year    Active Member of [employer] or Organizations: No    Attends Engineer, structural: Not on file    Marital Status: Never married  Intimate Partner Violence: Unknown (06/07/2021)   Received from [facility]   HITS    Physically Hurt: Not on file    Insult or Talk Down To: Not on file    Threaten Physical Harm: Not on file    Scream or Curse: Not on file    FAMILY HISTORY: Family History  Problem Relation Age of Onset   Hyperlipidemia Mother    Diabetes Mother    Hypertension Mother    Drug abuse Mother    Obesity Mother    Drug abuse Father    Heart disease Father    Hypertension Father    Diabetes Maternal Grandmother    Cancer Maternal Grandmother        breast   Hypertension Maternal Grandmother    Hypertension Maternal Grandfather    Hypertension Paternal Grandmother    Obesity Paternal Grandfather    Hypertension Paternal Grandfather    Cancer Maternal Aunt        melanoma    Asthma Other    Hyperlipidemia Other    Hypertension Other    Stroke Other    Heart disease Other    Obesity Other     ALLERGIES:  is allergic to sulfa antibiotics and misc. sulfonamide containing compounds.  MEDICATIONS:  Current Outpatient Medications  Medication Sig Dispense Refill   apixaban  (ELIQUIS ) 5 MG TABS tablet Take 1 tablet (5 mg total) by mouth 2 (two) times daily. 60 tablet 6   albuterol  (VENTOLIN  HFA) 108 (90 Base) MCG/ACT inhaler Inhale 1 - 2 puffs into the lungs every 6 hours as needed for shortness of breath or wheezing. 6.7 g 3   buPROPion  (WELLBUTRIN  XL) 300 MG 24 hr tablet Take 1 tablet (300 mg total) by mouth daily. 90 tablet 0   fluticasone -salmeterol (ADVAIR  DISKUS) 100-50 MCG/ACT AEPB Inhale 1  puff into the lungs 2 times daily. 60 each 3   hydrochlorothiazide  (HYDRODIURIL ) 25 MG tablet Take 1 tablet (25 mg total) by mouth daily. 90 tablet 1   hydrOXYzine  (ATARAX ) 10 MG tablet Take 1 tablet (10 mg total) by mouth 3 (three) times daily as needed. 30 tablet 1   levonorgestrel (KYLEENA) 19.5 MG IUD by Intrauterine route.     Loratadine (CLARITIN) 10 MG CAPS Take 10 mg by mouth daily.     ondansetron  (ZOFRAN ) 4 MG tablet Take 1 tablet (4 mg) by mouth every 8 hours as needed for nausea or vomiting. 20 tablet 0  pantoprazole  (PROTONIX ) 40 MG tablet Take 1 tablet (40 mg total) by mouth daily. 90 tablet 1   sertraline  (ZOLOFT ) 25 MG tablet Take 1 tablet (25 mg total) by mouth daily. 90 tablet 0   SUMAtriptan  (IMITREX ) 50 MG tablet Take 1 tablet (50 mg total) by mouth every 2 (two) hours as needed for migraine. May repeat in 2 hours if headache persists or recurs. 9 tablet 3   triamcinolone  cream (KENALOG ) 0.1 % Apply  topically 2 times daily. 30 g 0   No current facility-administered medications for this visit.    REVIEW OF SYSTEMS:   Constitutional: ( - ) fevers, ( - )  chills , ( - ) night sweats Eyes: ( - ) blurriness of vision, ( - ) double vision, ( - ) watery eyes Ears, nose, mouth, throat, and face: ( - ) mucositis, ( - ) sore throat Respiratory: ( - ) cough, ( - ) dyspnea, ( - ) wheezes Cardiovascular: ( - ) palpitation, ( - ) chest discomfort, ( - ) lower extremity swelling Gastrointestinal:  ( - ) nausea, ( - ) heartburn, ( - ) change in bowel habits Skin: ( - ) abnormal skin rashes Lymphatics: ( - ) new lymphadenopathy, ( - ) easy bruising Neurological: ( - ) numbness, ( - ) tingling, ( - ) new weaknesses Behavioral/Psych: ( - ) mood change, ( - ) new changes  All other systems were reviewed with the patient and are negative.  PHYSICAL EXAMINATION:  Vitals:   09/15/23 0853  BP: (!) 137/92  Pulse: 96  Resp: 17  Temp: 97.9 F (36.6 C)  SpO2: 96%    Filed Weights    09/15/23 0853  Weight: (!) 454 lb 11.2 oz (206.3 kg)     GENERAL: Well-appearing obese African-American female, alert, no distress and comfortable SKIN: skin color, texture, turgor are normal, no rashes or significant lesions EYES: conjunctiva are pink and non-injected, sclera clear LUNGS: clear to auscultation and percussion with normal breathing effort HEART: regular rate & rhythm and no murmurs and no lower extremity edema Musculoskeletal: no cyanosis of digits and no clubbing  PSYCH: alert & oriented x 3, fluent speech NEURO: no focal motor/sensory deficits  LABORATORY DATA:  I have reviewed the data as listed    Latest Ref Rng & Units 09/15/2023    8:35 AM 06/09/2023    9:53 AM 11/07/2022   12:38 PM  CBC  WBC 4.0 - 10.5 K/uL 8.0  8.1  7.6   Hemoglobin 12.0 - 15.0 g/dL 86.8  87.0  86.4   Hematocrit 36.0 - 46.0 % 39.2  40.0  40.0   Platelets 150 - 400 K/uL 316  336  346        Latest Ref Rng & Units 09/15/2023    8:35 AM 06/09/2023    9:53 AM 11/07/2022   12:38 PM  CMP  Glucose 70 - 99 mg/dL 98  93  95   BUN 6 - 20 mg/dL 9  11  9    Creatinine 0.44 - 1.00 mg/dL 9.14  9.06  9.12   Sodium 135 - 145 mmol/L 139  140  137   Potassium 3.5 - 5.1 mmol/L 4.3  4.1  4.3   Chloride 98 - 111 mmol/L 105  106  102   CO2 22 - 32 mmol/L 28  29  29    Calcium 8.9 - 10.3 mg/dL 9.2  9.3  9.7   Total Protein 6.5 - 8.1 g/dL 7.1  7.5  8.0   Total Bilirubin 0.0 - 1.2 mg/dL 0.6  0.4  0.6   Alkaline Phos 38 - 126 U/L 87  92  87   AST 15 - 41 U/L 15  11  17    ALT 0 - 44 U/L 15  13  21      RADIOGRAPHIC STUDIES: DG Chest 2 View Result Date: 08/21/2023 CLINICAL DATA:  morbid obesity, planning for gastric sleeve surgery EXAM: CHEST - 2 VIEW COMPARISON:  11/10/2016 chest radiograph. FINDINGS: Stable cardiomediastinal silhouette with normal heart size. No pneumothorax. No pleural effusion. Lungs appear clear, with no acute consolidative airspace disease and no pulmonary edema. IMPRESSION: No active  cardiopulmonary disease. Electronically Signed   By: Selinda DELENA Blue M.D.   On: 08/21/2023 10:16   DG UGI W DOUBLE CM (HD BA) Result Date: 08/21/2023 CLINICAL DATA:  Morbid obesity, planning for gastric sleeve EXAM: UPPER GI SERIES WITH KUB TECHNIQUE: After obtaining a scout radiograph a routine upper GI series was performed using thin and high density barium. FLUOROSCOPY: Radiation Exposure Index (as provided by the fluoroscopic device): 167 mGy Kerma COMPARISON:  11/10/2016 upper GI FINDINGS: Scout radiograph demonstrates no dilated small bowel loops. Mild colonic stool. No evidence of pneumatosis or pneumoperitoneum. No radiopaque nephrolithiasis. IUD overlies the left lower sacrum. Mild esophageal dysmotility characterized by intermittent weakening of primary peristalsis in the lower thoracic esophagus. Normal esophageal distensibility, with no evidence of esophageal mass, ulcer or stricture. A 13 mm swallowed barium tablet traversed the esophagus into the stomach without delay. Tiny sliding hiatal hernia. Marked gastroesophageal reflux elicited to the level of the upper thoracic esophagus with water siphon test. No gastric fold thickening. No gastric filling defects, ulcers or significant erosions. Normal gastric emptying. Normal duodenal bulb and C sweep. Normal duodenal jejunal junction to the left of the spine. Visualized proximal jejunal loops are normal caliber and without fold thickening. IMPRESSION: 1. Tiny sliding hiatal hernia. Marked gastroesophageal reflux elicited. 2. Mild esophageal dysmotility, characteristic of chronic reflux related dysmotility. 3. Otherwise normal upper GI series. No evidence of strictures, masses or peptic ulcer disease. Electronically Signed   By: Selinda DELENA Blue M.D.   On: 08/21/2023 10:15    ASSESSMENT & PLAN Debra Walters 33 y.o. female with medical history significant for bilateral pulmonary emboli who presents for a follow up visit.   # Unprovoked Bilateral  Pulmonary Emboli  -- At this time findings are most consistent with bilateral pulmonary emboli that were unprovoked.  The patient does have morbid obesity which may have contributed, though this does not appear to be a modifiable risk factor --Full hypercoagulable studies performed on 06/06/2020 at [facility] showed no clear abnormalities.  Testing include Antithrombin III, protein C, protein S, and lupus anticoagulant testing.  Additionally factor V Leiden and prothrombin gene were tested for and no clear abnormalities were found. Plan:  --Recommend indefinite anticoagulation.  She is currently taking Xarelto  20 mg p.o. daily.  Given her obesity would not recommend decreasing down to maintenance dosing.  --Given her upcoming gastric sleeve I would recommend transitioning her to Eliquis  therapy after the procedure.  Xarelto  is predominantly absorbed in the stomach whereas Eliquis  is absorbed more in the small bowel. --Will call in a prescription of Eliquis  to her pharmacy to be started after her procedure. --Recommend holding Xarelto  at least 48 hours prior to her procedure. --Strongly recommend restarting anticoagulation therapy as soon as is feasible after the completion of her surgery.  If she is hospitalized could  consider therapeutic level Lovenox before transitioning her to Eliquis  at discharge. --Labs today show white blood cell 8.0, hemoglobin 13.1, MCV 89.5, platelets 316.  Creatinine is 0.85 with normal LFTs.  Okay to continue with DOAC therapy. --Return to clinic in 3 months time to reevaluate   #Mild Anemia--resolved.  -- Hgb normalized to 13.1 today.  -- Discussed with her that iron deficiency and vitamin B12 deficiency are common nutritional deficiencies after procedure of this nature.  No orders of the defined types were placed in this encounter.   All questions were answered. The patient knows to call the clinic with any problems, questions or concerns.  A total of more than 25  minutes were spent on this encounter with face-to-face time and non-face-to-face time, including preparing to see the patient, ordering tests and/or medications, counseling the patient and coordination of care as outlined above.   Norleen IVAR Kidney, MD Department of Hematology/Oncology [facility] at [facility] Phone: [phone] Pager: [phone] Email: [email or web address]  09/15/2023 9:49 AM

## 2023-09-23 ENCOUNTER — Ambulatory Visit: Admitting: Skilled Nursing Facility1

## 2023-09-25 ENCOUNTER — Other Ambulatory Visit (HOSPITAL_COMMUNITY): Payer: Self-pay

## 2023-10-01 ENCOUNTER — Other Ambulatory Visit (HOSPITAL_COMMUNITY): Payer: Self-pay

## 2023-10-01 ENCOUNTER — Other Ambulatory Visit: Payer: Self-pay

## 2023-10-01 ENCOUNTER — Other Ambulatory Visit: Payer: Self-pay | Admitting: Nurse Practitioner

## 2023-10-01 MED ORDER — PANTOPRAZOLE SODIUM 40 MG PO TBEC
40.0000 mg | DELAYED_RELEASE_TABLET | Freq: Every day | ORAL | 1 refills | Status: AC
  Filled 2023-10-01: qty 90, 90d supply, fill #0
  Filled 2024-03-08: qty 90, 90d supply, fill #1

## 2023-10-01 MED ORDER — BUPROPION HCL ER (XL) 300 MG PO TB24
300.0000 mg | ORAL_TABLET | Freq: Every day | ORAL | 0 refills | Status: AC
  Filled 2023-10-01: qty 90, 90d supply, fill #0

## 2023-10-01 NOTE — Telephone Encounter (Signed)
 Requesting: pantoprazole  (PROTONIX ) 40 MG tablet , buPROPion  (WELLBUTRIN  XL) 300 MG 24 hr tablet  Last Visit: 09/09/2023 Next Visit: Visit date not found Last Refill: 02/18/2023, 04/22/2023  Please Advise

## 2023-10-03 ENCOUNTER — Other Ambulatory Visit (HOSPITAL_COMMUNITY): Payer: Self-pay

## 2023-10-12 ENCOUNTER — Encounter: Payer: Self-pay | Admitting: Hematology and Oncology

## 2023-11-03 ENCOUNTER — Encounter: Payer: Self-pay | Admitting: Skilled Nursing Facility1

## 2023-11-03 ENCOUNTER — Encounter: Attending: Surgery | Admitting: Skilled Nursing Facility1

## 2023-11-03 VITALS — Ht 66.0 in | Wt >= 6400 oz

## 2023-11-03 DIAGNOSIS — E66813 Obesity, class 3: Secondary | ICD-10-CM | POA: Diagnosis not present

## 2023-11-03 DIAGNOSIS — Z6841 Body Mass Index (BMI) 40.0 and over, adult: Secondary | ICD-10-CM | POA: Diagnosis not present

## 2023-11-03 NOTE — Progress Notes (Unsigned)
 Pre-surgery nutrition visits  Virtual appt; pt agreeable to limitations of this visit type; pt identified by name and DOB.  Pt states she has been more stressed at work due to [employer] new promotion role but still doing old role and training the new postion. Mindful meals: try not to do (think about food or changing it or why I eat so much verse nothing on others, does not end up anytiuhg positive not able to identify emotion Pt states she Reached out to [facility] but finds she rather work with someone outside of [facility] so plans on reaching out to others.  Pt states she Ordered groceries and is eating breakfast and dinner.  Pt states she feels she Reduced binge eating events having the last episode be once last week.    Trying to keep foods in the house for breakfast to eat 3 meals a day still struggling toe at all 3 a day in particular lunch. Pt states she is hoping now that her preivous postion is filled she will not manger encourages her to take lunch. Pt states she continued to not drink soda, has been eating breakfast more often, and rerouting negative self talk.   Medical Nutrition Therapy  Appt Start Time: 4:30    End Time: 5:02  Patient was seen on 08/27/2023 for Pre-Operative Nutrition Assessment. Purpose of todays visit  enhance perioperative outcomes along with a healthy weight maintenance   Referral stated Supervised Weight Loss (SWL) visits needed: 0  Not cleared at this time:  Pt to follow up for minimum of 2 more visits to assist pt with progressing through stages of change/further nutrition education. RD advised pt that this follow up visit is not mandated through insurance. Pt verbalized agreement.  Patient verbalizes understanding of the importance of addressing disordered eating behaviors prior to bariatric surgery. Patient is agreeable and motivated to continue behavioral work to improve eating habits.  Plan: Continue behavioral change and/or counseling focused on disordered  eating patterns and healthy eating habits. Monitor progress closely in preoperative visits. Coordinate care with mental health professionals   Re-evaluate at each visit starting with 2 more visits as a baseline to add on per evaluation communicating with [facility] once pt is working with a therapist and better understands her relationship with food  Planned surgery: Sleeve gastrectomy  Pt expectation of surgery: to lose weight  Pt expectation of dietitian: to assist in understanding her relationship with food    NUTRITION ASSESSMENT   Anthropometrics  Start weight at [facility]: 460 lbs (date: 08/27/2023)  Height: 66 in BMI: 74.25 kg/m2     Clinical   Pharmacotherapy: History of weight loss medication used: Phentermine, GLP  Medical hx: HTN Medications: see list  Labs:   Notable signs/symptoms: nausea  Any previous deficiencies? No  Evaluation of Nutritional Deficiencies: Micronutrient Nutrition Focused Physical Exam: Hair: No issues observed Eyes: No issues observed Mouth: No issues observed Neck: No issues observed Nails: No issues observed Skin: No issues observed  Lifestyle & Dietary Hx  Current Physical Activity Recommendations state 150 minutes per week of moderate to vigorous movement including Cardio and 1-2 days of resistance activities as well as flexibility/balance activities:  Pts current physical activity: 0, with 0% recommendation reached   Sleep Hygiene: duration and quality: throughout the night 6+ not feeling rested   Current Patient Perceived Stress Level as stated by pt on a scale of 1-10: pt stated 5+       Stress Management Techniques: TV, movies, coloring books  According to the Dietary Guidelines for Americans Recommendation: equivalent 1.5-2 cups fruits per day, equivalent 2-3 cups vegetables per day and at least half all grains whole  Fruit servings per day (on average): 1, meeting 50% recommendation  Non-starchy vegetable servings per day (on average):  1, meeting 50% recommendation  Whole Grains per day (on average): 0  Number of meals missed/skipped per week out of 21: 10+  24-Hr Dietary Recall First Meal: coffee + skipped Snack: grits + cheese Second Meal: peppers + cheese + lunch meat Snack:  Third Meal: spaghetti + garlic bread  Snack: 2 cookie sandwiches  Beverages: water, water + flavoring   Alcoholic beverages per week: 0   Estimated Energy Needs Calories: 1800   NUTRITION DIAGNOSIS  Overweight/obesity ([place]-3.3) related to past poor dietary habits and physical inactivity as evidenced by patient w/ planned sleeve surgery following dietary guidelines for continued weight loss.    NUTRITION INTERVENTION  Nutrition counseling (C-1) and education (E-2) to facilitate bariatric surgery goals. Educated pt on micronutrient deficiencies post-surgery and behavioral/dietary strategies to start in order to mitigate that risk Nutrition counseling: Educated pt on the importance of consistently eating throughout the day: Sustained Energy Levels: Regular meals and snacks help maintain stable blood sugar levels, providing a steady source of energy throughout the day. This can prevent energy crashes and help you stay focused and alert. Metabolism Regulation: Eating regularly can help keep your metabolism active and functioning efficiently. Consistent meals can prevent your body from going into starvation mode, where it conserves energy and slows down metabolism. Nutrient Absorption: Spacing out meals allows your body to better absorb and utilize nutrients from food. Eating throughout the day ensures that your body receives a continuous supply of essential vitamins, minerals, and other nutrients. Appetite Control: Regular meals and snacks can help regulate appetite and prevent overeating. When you go too long without eating, you may become overly hungry and more likely to overindulge or make unhealthy food choices. Blood Sugar Regulation:  Eating at regular intervals helps prevent spikes and crashes in blood sugar levels. This is particularly important for individuals with diabetes or those at risk of developing diabetes. Improved Mood and Concentration: Balanced, regular meals can positively impact your mood and cognitive function. Stable blood sugar levels support brain function, leading to better concentration, memory, and overall mental well-being. Muscle Maintenance: Consuming protein-rich snacks or meals throughout the day can help support muscle repair and maintenance, especially for individuals who are physically active or engaged in strength training. Overall, eating throughout the day supports your body's needs for energy, nutrients, and overall health, promoting a balanced and sustainable approach to nutrition Dietitian worked with pt on their emotional relationship with food as this is integral to the pts maintenance of a healthy lifestyle long term. Identify Triggers: What leads one to emotionally eat? Is there a sudden onset of cravings? Is there rapid consumption?  Awareness  Explore and identify emotional triggers that lead to certain eating behaviors. These triggers could be stress, boredom, sadness, loneliness, or a plethora of other emotions. Mindful Eating: Encouraged mindfulness during meals. This involves paying attention to the sensory experience of eating, such as the taste, texture, and smell of food. It can help increase awareness of hunger and satisfaction cues. Gave pt mindfulness exercise  Emotional Awareness: Fostered emotional awareness by helping the patient recognize and express their emotions in ways other than turning to food (moving through the emotion rather than stuffing it down with food). Encouraged journaling, talking to  a therapist, or engaging in activities that provide emotional support and assist the pt in more appropriately dealing with their feelings. Cognitive Behavioral  Techniques: Introduce cognitive-behavioral techniques to challenge and change negative thought patterns related to food. Help the patient develop healthier coping mechanisms for dealing with emotions. Establish Healthy Coping Strategies: Assist the patient in finding alternative coping strategies for managing emotions without resorting to food. This could include exercise, meditation, deep breathing, or engaging in hobbies. Support System: Encourage the patient to build a strong support system, whether it's through friends, family, or support groups. Having a network can provide emotional support during challenging times. Self-Compassion: Foster self-compassion and encourage the patient to be kind to themselves. Building a positive self-image can contribute to healthier relationships with food. Gradual Changes: Support the patient in making gradual, sustainable changes to their eating habits. Small steps over time are more likely to lead to long-term success. Professional Counseling: Advised the patient work with a Armed forces technical officer. Professional guidance can provide additional tools and strategies for addressing emotional issues. As a dietitian I cannot offer mental health advice Nutritional Education: Provided education on the nutritional aspects of food and help the patient understand the role of food in nourishing the body rather than solely as a way to cope with emotions.  *Goals that are bolded indicate the pt would like to start working towards these   Handouts Previously Provided Include  Should I eat Mindful meals  Learning Style & Readiness for Change Teaching method utilized: Visual, Auditory, and hands on  Demonstrated degree of understanding via: Teach Back  Readiness Level: Contemplative/action  Barriers to learning/adherence to lifestyle change: Disordered eating patterns  RD's Notes for Next Visit Continue working on relationship with food and  possible BED    MONITORING & EVALUATION Dietary intake, weekly physical activity, body weight, and preoperative behavioral change goals   Next Steps  Patient is to follow up at [facility] for pre surgery follow up in 2 weeks

## 2023-11-13 ENCOUNTER — Other Ambulatory Visit: Payer: Self-pay | Admitting: Nurse Practitioner

## 2023-11-14 ENCOUNTER — Other Ambulatory Visit (HOSPITAL_COMMUNITY): Payer: Self-pay

## 2023-11-16 ENCOUNTER — Other Ambulatory Visit (HOSPITAL_COMMUNITY): Payer: Self-pay

## 2023-11-16 ENCOUNTER — Other Ambulatory Visit: Payer: Self-pay

## 2023-11-16 MED ORDER — SERTRALINE HCL 25 MG PO TABS
25.0000 mg | ORAL_TABLET | Freq: Every day | ORAL | 0 refills | Status: AC
  Filled 2023-11-16: qty 90, 90d supply, fill #0

## 2023-11-16 NOTE — Telephone Encounter (Signed)
 Requesting: sertraline  (ZOLOFT ) 25 MG tablet  Last Visit: 09/09/2023 Next Visit: Visit date not found Due for f/u around 03/04/2024 Last Refill: 05/27/2023  Please Advise

## 2023-11-23 ENCOUNTER — Encounter: Payer: Self-pay | Admitting: Skilled Nursing Facility1

## 2023-11-23 ENCOUNTER — Encounter: Attending: Surgery | Admitting: Skilled Nursing Facility1

## 2023-11-23 VITALS — Ht 66.0 in | Wt >= 6400 oz

## 2023-11-23 DIAGNOSIS — Z6841 Body Mass Index (BMI) 40.0 and over, adult: Secondary | ICD-10-CM | POA: Diagnosis not present

## 2023-11-23 DIAGNOSIS — E66813 Obesity, class 3: Secondary | ICD-10-CM | POA: Insufficient documentation

## 2023-11-23 NOTE — Progress Notes (Signed)
 Pre-surgery nutrition visits   Medical Nutrition Therapy  Appt Start Time: 12:07    End Time: 12:30  Patient was seen on 08/27/2023 for Pre-Operative Nutrition Assessment. Purpose of todays visit  enhance perioperative outcomes along with a healthy weight maintenance   Referral stated Supervised Weight Loss (SWL) visits needed: 0  Not cleared at this time:  Pt to follow up for minimum of 2 more visits to assist pt with progressing through stages of change/further nutrition education. RD advised pt that this follow up visit is not mandated through insurance. Pt verbalized agreement. CONTINUED  Patient verbalizes understanding of the importance of addressing disordered eating behaviors prior to bariatric surgery. Patient is agreeable and motivated to continue behavioral work to improve eating habits.  Plan: Continue behavioral change and/or counseling focused on disordered eating patterns and healthy eating habits. Monitor progress closely in preoperative visits. Coordinate care with mental health professionals: Update pt has reached out to a therapist but is waiting on them to reach back out this therapist did not reach out so she has to find someone else to reach out to Nathanel Collet was discussed as a possible connection.   Re-evaluate at each visit starting with 2 more visits as a baseline to add on per evaluation communicating with [facility] once pt is working with a therapist and better understands her relationship with food  Planned surgery: Sleeve gastrectomy  Pt expectation of surgery: to lose weight  Pt expectation of dietitian: to assist in understanding her relationship with food    NUTRITION ASSESSMENT   Anthropometrics  Start weight at [facility]: 460 lbs (date: 08/27/2023)  Height: 66 in BMI: 75.38 kg/m2   Weight: 467 pounds   Clinical   Pharmacotherapy: History of weight loss medication used: Phentermine, GLP  Medical hx: HTN Medications: see list  Labs:   Notable  signs/symptoms: nausea  Any previous deficiencies? No  Evaluation of Nutritional Deficiencies: Micronutrient Nutrition Focused Physical Exam: Hair: No issues observed Eyes: No issues observed Mouth: No issues observed Neck: No issues observed Nails: No issues observed Skin: No issues observed  Lifestyle & Dietary Hx  Current Physical Activity Recommendations state 150 minutes per week of moderate to vigorous movement including Cardio and 1-2 days of resistance activities as well as flexibility/balance activities:  Pts current physical activity: 0, with 0% recommendation reached   Sleep Hygiene: duration and quality: throughout the night 6+ not feeling rested   Current Patient Perceived Stress Level as stated by pt on a scale of 1-10: pt stated 5+       Stress Management Techniques: TV, movies, coloring books   According to the Dietary Guidelines for Americans Recommendation: equivalent 1.5-2 cups fruits per day, equivalent 2-3 cups vegetables per day and at least half all grains whole  Fruit servings per day (on average): 1, meeting 50% recommendation  Non-starchy vegetable servings per day (on average): 1, meeting 50% recommendation  Whole Grains per day (on average): 0  Number of meals missed/skipped per week out of 21: 10+   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 Started using myfitness pal Tuesday of last week last Friday did not after ate   Use fitness pal as a relfxction tool  catching negvit eself talk   24-Hr Dietary Recall First Meal: coffee + skipped Snack: grits + cheese Second Meal: peppers + cheese + lunch meat Snack:  Third Meal: spaghetti + garlic bread  Snack: 2 cookie sandwiches  Beverages: water, water + flavoring  Alcoholic beverages per week: 0   Estimated Energy Needs Calories: 1800   NUTRITION DIAGNOSIS  Overweight/obesity ([place]-3.3) related to past poor dietary habits and physical inactivity as evidenced by patient w/ planned  sleeve surgery following dietary guidelines for continued weight loss.    NUTRITION INTERVENTION  Nutrition counseling (C-1) and education (E-2) to facilitate bariatric surgery goals. Educated pt on micronutrient deficiencies post-surgery and behavioral/dietary strategies to start in order to mitigate that risk Nutrition counseling: Educated pt on the importance of consistently eating throughout the day: Sustained Energy Levels: Regular meals and snacks help maintain stable blood sugar levels, providing a steady source of energy throughout the day. This can prevent energy crashes and help you stay focused and alert. Metabolism Regulation: Eating regularly can help keep your metabolism active and functioning efficiently. Consistent meals can prevent your body from going into starvation mode, where it conserves energy and slows down metabolism. Nutrient Absorption: Spacing out meals allows your body to better absorb and utilize nutrients from food. Eating throughout the day ensures that your body receives a continuous supply of essential vitamins, minerals, and other nutrients. Appetite Control: Regular meals and snacks can help regulate appetite and prevent overeating. When you go too long without eating, you may become overly hungry and more likely to overindulge or make unhealthy food choices. Blood Sugar Regulation: Eating at regular intervals helps prevent spikes and crashes in blood sugar levels. This is particularly important for individuals with diabetes or those at risk of developing diabetes. Improved Mood and Concentration: Balanced, regular meals can positively impact your mood and cognitive function. Stable blood sugar levels support brain function, leading to better concentration, memory, and overall mental well-being. Muscle Maintenance: Consuming protein-rich snacks or meals throughout the day can help support muscle repair and maintenance, especially for individuals who are physically  active or engaged in strength training. Overall, eating throughout the day supports your body's needs for energy, nutrients, and overall health, promoting a balanced and sustainable approach to nutrition Dietitian worked with pt on their emotional relationship with food as this is integral to the pts maintenance of a healthy lifestyle long term. Identify Triggers: What leads one to emotionally eat? Is there a sudden onset of cravings? Is there rapid consumption?  Awareness  Explore and identify emotional triggers that lead to certain eating behaviors. These triggers could be stress, boredom, sadness, loneliness, or a plethora of other emotions. Mindful Eating: Encouraged mindfulness during meals. This involves paying attention to the sensory experience of eating, such as the taste, texture, and smell of food. It can help increase awareness of hunger and satisfaction cues. Gave pt mindfulness exercise  Emotional Awareness: Fostered emotional awareness by helping the patient recognize and express their emotions in ways other than turning to food (moving through the emotion rather than stuffing it down with food). Encouraged journaling, talking to a therapist, or engaging in activities that provide emotional support and assist the pt in more appropriately dealing with their feelings. Cognitive Behavioral Techniques: Introduce cognitive-behavioral techniques to challenge and change negative thought patterns related to food. Help the patient develop healthier coping mechanisms for dealing with emotions. Establish Healthy Coping Strategies: Assist the patient in finding alternative coping strategies for managing emotions without resorting to food. This could include exercise, meditation, deep breathing, or engaging in hobbies. Support System: Encourage the patient to build a strong support system, whether it's through friends, family, or support groups. Having a network can provide emotional support during  challenging times. Self-Compassion: Foster self-compassion  and encourage the patient to be kind to themselves. Building a positive self-image can contribute to healthier relationships with food. Gradual Changes: Support the patient in making gradual, sustainable changes to their eating habits. Small steps over time are more likely to lead to long-term success. Professional Counseling: Advised the patient work with a Armed forces technical officer. Professional guidance can provide additional tools and strategies for addressing emotional issues. As a dietitian I cannot offer mental health advice Nutritional Education: Provided education on the nutritional aspects of food and help the patient understand the role of food in nourishing the body rather than solely as a way to cope with emotions.   Handouts Previously Provided Include  Should I eat Mindful meals  Learning Style & Readiness for Change Teaching method utilized: Visual, Auditory, and hands on  Demonstrated degree of understanding via: Teach Back  Readiness Level: Contemplative/action  Barriers to learning/adherence to lifestyle change: Disordered eating patterns  RD's Notes for Next Visit Continue working on relationship with food and possible BED  Goals: Use a Large Language model to help create re frame ideas when you have a negative thought or criticism  Reach out to [employer] as a possible therapist to work with    MONITORING & EVALUATION Dietary intake, weekly physical activity, body weight, and preoperative behavioral change goals   Next Steps  Patient is to follow up at [facility] for pre surgery follow up in 1-2 weeks

## 2023-12-09 ENCOUNTER — Inpatient Hospital Stay: Admitting: Hematology and Oncology

## 2023-12-09 ENCOUNTER — Other Ambulatory Visit: Payer: Self-pay | Admitting: Hematology and Oncology

## 2023-12-09 ENCOUNTER — Inpatient Hospital Stay: Attending: Hematology and Oncology

## 2023-12-09 DIAGNOSIS — Z86711 Personal history of pulmonary embolism: Secondary | ICD-10-CM

## 2023-12-09 NOTE — Progress Notes (Deleted)
 [facility] [phone]   [phone]  PROGRESS NOTE  Patient Care Team: Nedra Tinnie LABOR, NP as PCP - General (Internal Medicine)  Hematological/Oncological History # Unprovoked Bilateral Pulmonary Emboli 06/06/2020: presented with chest pain and shortness of breath to [facility]. CT angio showed acute pulmonary emboli involving all lobes of the lungs. Overall clot burden is moderate to large.  Lower extremity Dopplers negative. Started on Xarelto  therapy.  04/22/2021: establish care with Dr. Federico   Interval History:  Debra Walters 33 y.o. female with medical history significant for bilateral pulmonary emboli who presents for a follow up visit. The patient's last visit was on 06/09/2023. In the interim since the last visit she has had no major changes in her health.  She comes in today for hematology clearance prior to a gastric sleeve procedure.   On exam today [name] reports she has been well overall in the interim since our last visit.  She reports that she does not currently have a date scheduled for her gastric sleeve surgery.  She reports that she is taking Xarelto  and tolerating it well with no bleeding, bruising, or dark stools.  She has no signs or symptoms concerning for recurrent clot such as leg pain, leg swelling, chest pain, or shortness of breath.  She reports Xarelto  is costing her $10 - $25 per month.  She reports that she does have an IUD in place and her menstrual cycles are not particularly heavy.  She otherwise denies any fevers, chills, sweats, nausea, vomiting or diarrhea.  A full 10 point ROS is otherwise negative.  The bulk of our discussion focused on next steps.  We discussed holding Xarelto  therapy to 48 hours prior to surgery.  Additionally we discussed that Xarelto  is predominantly absorbed in the stomach and therefore we recommend transition to Eliquis .  We also discussed expected long-term nutritional deficiencies as a  result of this procedure.  She voiced understanding of our findings and recommendations moving forward.  MEDICAL HISTORY:  Past Medical History:  Diagnosis Date   Asthma    GERD (gastroesophageal reflux disease)    Hypertension    Morbid obesity (HCC)    Pulmonary embolism (HCC) 06/01/2020    SURGICAL HISTORY: No past surgical history on file.  SOCIAL HISTORY: Social History   Socioeconomic History   Marital status: Single    Spouse name: Not on file   Number of children: Not on file   Years of education: Not on file   Highest education level: Some college, no degree  Occupational History   Not on file  Tobacco Use   Smoking status: Never   Smokeless tobacco: Never  Vaping Use   Vaping status: Never Used  Substance and Sexual Activity   Alcohol use: Yes    Alcohol/week: 1.0 standard drink of alcohol    Types: 1 drink(s) per week    Comment: monthly   Drug use: No   Sexual activity: Not on file    Comment: Kyleena placed 2022  Other Topics Concern   Not on file  Social History Narrative   Not on file   Social Drivers of Health   Financial Resource Strain: Medium Risk (09/02/2023)   Overall Financial Resource Strain (CARDIA)    Difficulty of Paying Living Expenses: Somewhat hard  Food Insecurity: No Food Insecurity (09/02/2023)   Hunger Vital Sign    Worried About Running Out of Food in the Last Year: Never true    Ran Out of  Food in the Last Year: Never true  Transportation Needs: No Transportation Needs (09/02/2023)   PRAPARE - Administrator, Civil Service (Medical): No    Lack of Transportation (Non-Medical): No  Physical Activity: Inactive (09/02/2023)   Exercise Vital Sign    Days of Exercise per Week: 0 days    Minutes of Exercise per Session: Not on file  Stress: No Stress Concern Present (09/02/2023)   [employer] of Occupational Health - Occupational Stress Questionnaire    Feeling of Stress: Only a little  Social Connections: Moderately  Isolated (09/02/2023)   Social Connection and Isolation Panel    Frequency of Communication with Friends and Family: More than three times a week    Frequency of Social Gatherings with Friends and Family: Once a week    Attends Religious Services: 1 to 4 times per year    Active Member of [employer] or Organizations: No    Attends Engineer, structural: Not on file    Marital Status: Never married  Intimate Partner Violence: Unknown (06/07/2021)   Received from [facility]   HITS    Physically Hurt: Not on file    Insult or Talk Down To: Not on file    Threaten Physical Harm: Not on file    Scream or Curse: Not on file    FAMILY HISTORY: Family History  Problem Relation Age of Onset   Hyperlipidemia Mother    Diabetes Mother    Hypertension Mother    Drug abuse Mother    Obesity Mother    Drug abuse Father    Heart disease Father    Hypertension Father    Diabetes Maternal Grandmother    Cancer Maternal Grandmother        breast   Hypertension Maternal Grandmother    Hypertension Maternal Grandfather    Hypertension Paternal Grandmother    Obesity Paternal Grandfather    Hypertension Paternal Grandfather    Cancer Maternal Aunt        melanoma    Asthma Other    Hyperlipidemia Other    Hypertension Other    Stroke Other    Heart disease Other    Obesity Other     ALLERGIES:  is allergic to sulfa antibiotics and misc. sulfonamide containing compounds.  MEDICATIONS:  Current Outpatient Medications  Medication Sig Dispense Refill   albuterol  (VENTOLIN  HFA) 108 (90 Base) MCG/ACT inhaler Inhale 1 - 2 puffs into the lungs every 6 hours as needed for shortness of breath or wheezing. 6.7 g 3   apixaban  (ELIQUIS ) 5 MG TABS tablet Take 1 tablet (5 mg total) by mouth 2 (two) times daily. 60 tablet 6   buPROPion  (WELLBUTRIN  XL) 300 MG 24 hr tablet Take 1 tablet (300 mg total) by mouth daily. 90 tablet 0   fluticasone -salmeterol (ADVAIR  DISKUS) 100-50 MCG/ACT AEPB Inhale 1  puff into the lungs 2 times daily. 60 each 3   hydrochlorothiazide  (HYDRODIURIL ) 25 MG tablet Take 1 tablet (25 mg total) by mouth daily. 90 tablet 1   hydrOXYzine  (ATARAX ) 10 MG tablet Take 1 tablet (10 mg total) by mouth 3 (three) times daily as needed. 30 tablet 1   levonorgestrel (KYLEENA) 19.5 MG IUD by Intrauterine route.     Loratadine (CLARITIN) 10 MG CAPS Take 10 mg by mouth daily.     ondansetron  (ZOFRAN ) 4 MG tablet Take 1 tablet (4 mg) by mouth every 8 hours as needed for nausea or vomiting. 20 tablet 0  pantoprazole  (PROTONIX ) 40 MG tablet Take 1 tablet (40 mg total) by mouth daily. 90 tablet 1   sertraline  (ZOLOFT ) 25 MG tablet Take 1 tablet (25 mg total) by mouth daily. 90 tablet 0   SUMAtriptan  (IMITREX ) 50 MG tablet Take 1 tablet (50 mg total) by mouth every 2 (two) hours as needed for migraine. May repeat in 2 hours if headache persists or recurs. 9 tablet 3   triamcinolone  cream (KENALOG ) 0.1 % Apply  topically 2 times daily. 30 g 0   No current facility-administered medications for this visit.    REVIEW OF SYSTEMS:   Constitutional: ( - ) fevers, ( - )  chills , ( - ) night sweats Eyes: ( - ) blurriness of vision, ( - ) double vision, ( - ) watery eyes Ears, nose, mouth, throat, and face: ( - ) mucositis, ( - ) sore throat Respiratory: ( - ) cough, ( - ) dyspnea, ( - ) wheezes Cardiovascular: ( - ) palpitation, ( - ) chest discomfort, ( - ) lower extremity swelling Gastrointestinal:  ( - ) nausea, ( - ) heartburn, ( - ) change in bowel habits Skin: ( - ) abnormal skin rashes Lymphatics: ( - ) new lymphadenopathy, ( - ) easy bruising Neurological: ( - ) numbness, ( - ) tingling, ( - ) new weaknesses Behavioral/Psych: ( - ) mood change, ( - ) new changes  All other systems were reviewed with the patient and are negative.  PHYSICAL EXAMINATION:  There were no vitals filed for this visit.   There were no vitals filed for this visit.    GENERAL: Well-appearing  obese African-American female, alert, no distress and comfortable SKIN: skin color, texture, turgor are normal, no rashes or significant lesions EYES: conjunctiva are pink and non-injected, sclera clear LUNGS: clear to auscultation and percussion with normal breathing effort HEART: regular rate & rhythm and no murmurs and no lower extremity edema Musculoskeletal: no cyanosis of digits and no clubbing  PSYCH: alert & oriented x 3, fluent speech NEURO: no focal motor/sensory deficits  LABORATORY DATA:  I have reviewed the data as listed    Latest Ref Rng & Units 09/15/2023    8:35 AM 06/09/2023    9:53 AM 11/07/2022   12:38 PM  CBC  WBC 4.0 - 10.5 K/uL 8.0  8.1  7.6   Hemoglobin 12.0 - 15.0 g/dL 86.8  87.0  86.4   Hematocrit 36.0 - 46.0 % 39.2  40.0  40.0   Platelets 150 - 400 K/uL 316  336  346        Latest Ref Rng & Units 09/15/2023    8:35 AM 06/09/2023    9:53 AM 11/07/2022   12:38 PM  CMP  Glucose 70 - 99 mg/dL 98  93  95   BUN 6 - 20 mg/dL 9  11  9    Creatinine 0.44 - 1.00 mg/dL 9.14  9.06  9.12   Sodium 135 - 145 mmol/L 139  140  137   Potassium 3.5 - 5.1 mmol/L 4.3  4.1  4.3   Chloride 98 - 111 mmol/L 105  106  102   CO2 22 - 32 mmol/L 28  29  29    Calcium 8.9 - 10.3 mg/dL 9.2  9.3  9.7   Total Protein 6.5 - 8.1 g/dL 7.1  7.5  8.0   Total Bilirubin 0.0 - 1.2 mg/dL 0.6  0.4  0.6   Alkaline Phos 38 - 126 U/L  87  92  87   AST 15 - 41 U/L 15  11  17    ALT 0 - 44 U/L 15  13  21      RADIOGRAPHIC STUDIES: No results found.   ASSESSMENT & PLAN Debra Walters 33 y.o. female with medical history significant for bilateral pulmonary emboli who presents for a follow up visit.   # Unprovoked Bilateral Pulmonary Emboli  -- At this time findings are most consistent with bilateral pulmonary emboli that were unprovoked.  The patient does have morbid obesity which may have contributed, though this does not appear to be a modifiable risk factor --Full hypercoagulable studies performed  on 06/06/2020 at [facility] showed no clear abnormalities.  Testing include Antithrombin III, protein C, protein S, and lupus anticoagulant testing.  Additionally factor V Leiden and prothrombin gene were tested for and no clear abnormalities were found. Plan:  --Recommend indefinite anticoagulation.  She is currently taking Xarelto  20 mg p.o. daily.  Given her obesity would not recommend decreasing down to maintenance dosing.  --Given her upcoming gastric sleeve I would recommend transitioning her to Eliquis  therapy after the procedure.  Xarelto  is predominantly absorbed in the stomach whereas Eliquis  is absorbed more in the small bowel. --Will call in a prescription of Eliquis  to her pharmacy to be started after her procedure. --Recommend holding Xarelto  at least 48 hours prior to her procedure. --Strongly recommend restarting anticoagulation therapy as soon as is feasible after the completion of her surgery.  If she is hospitalized could consider therapeutic level Lovenox before transitioning her to Eliquis  at discharge. --Labs today show white blood cell 8.0, hemoglobin 13.1, MCV 89.5, platelets 316.  Creatinine is 0.85 with normal LFTs.  Okay to continue with DOAC therapy. --Return to clinic in 3 months time to reevaluate   #Mild Anemia--resolved.  -- Hgb normalized to 13.1 today.  -- Discussed with her that iron deficiency and vitamin B12 deficiency are common nutritional deficiencies after procedure of this nature.  No orders of the defined types were placed in this encounter.   All questions were answered. The patient knows to call the clinic with any problems, questions or concerns.  A total of more than 25 minutes were spent on this encounter with face-to-face time and non-face-to-face time, including preparing to see the patient, ordering tests and/or medications, counseling the patient and coordination of care as outlined above.   Norleen IVAR Kidney, MD Department of  Hematology/Oncology [facility] Phone: [phone] Pager: [phone] Email: [email or web address]  12/09/2023 1:14 PM

## 2024-01-12 ENCOUNTER — Encounter: Admitting: Skilled Nursing Facility1

## 2024-02-01 ENCOUNTER — Encounter: Payer: Self-pay | Admitting: Dietician

## 2024-02-01 ENCOUNTER — Encounter: Attending: Surgery | Admitting: Dietician

## 2024-02-01 VITALS — Ht 66.0 in | Wt >= 6400 oz

## 2024-02-01 DIAGNOSIS — E669 Obesity, unspecified: Secondary | ICD-10-CM | POA: Diagnosis present

## 2024-02-01 NOTE — Progress Notes (Signed)
 Pre-surgery nutrition visits  Medical Nutrition Therapy  Appt Start Time: 0806    End Time: 0832  Patient was seen on 08/27/2023 for Pre-Operative Nutrition Assessment. Purpose of todays visit  enhance perioperative outcomes along with a healthy weight maintenance   Referral stated Supervised Weight Loss (SWL) visits needed: 0  Not cleared at this time:  Pt to follow up for minimum of one visit to assist pt with progressing through stages of change/further nutrition education. RD advised pt that this follow up visit is not mandated through insurance. Pt verbalized agreement.  Patient verbalizes understanding of the importance of addressing disordered eating behaviors prior to bariatric surgery. Patient is agreeable and motivated to continue behavioral work to improve eating habits.  Plan: Continue behavioral change and/or counseling focused on disordered eating patterns and healthy eating habits. Monitor progress closely in preoperative visits. Coordinate care with mental health professionals: (update) pt has seen therapist twice and has another appointment scheduled.  Re-evaluate at each visit starting with 2 more visits as a baseline to add on per evaluation communicating with [facility] once pt is working with a therapist and better understands her relationship with food  Planned surgery: Sleeve gastrectomy Pt expectation of surgery: to lose weight Pt expectation of dietitian: to assist in understanding her relationship with food   NUTRITION ASSESSMENT   Anthropometrics  Start weight at [facility]: 460 lbs (date: 08/27/2023)  Height: 66 in BMI: 76.31 kg/m2   Weight: 472.8 pounds  Clinical   Pharmacotherapy: History of weight loss medication used: Phentermine, GLP  Medical hx: HTN Medications: see list  Labs:   Notable signs/symptoms: nausea  Any previous deficiencies? No  Evaluation of Nutritional Deficiencies: Micronutrient Nutrition Focused Physical Exam: Hair: No issues  observed Eyes: No issues observed Mouth: No issues observed Neck: No issues observed Nails: No issues observed Skin: No issues observed  Lifestyle & Dietary Hx  Pt states she has started talk space for counseling, stating she has an appointment on the 15 of December, stating she has already had two appointments. Pt states she typically drinks sparkling water, stating for the bubbles. Pt states she used to go the gym every day, stating she had blood clots and was told to reduce the physical activity. Pt states she gets checked every 6 months now that that issue is under control. Pt states she plans on increasing physical activity. Pt states she drinks at least 40 oz of plain water per day. 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 Pt states working with the dietitian to re-frame how to look at things has helped a lot. Pt states her counseling visit with be also related to a behavior plan to reprogram her bing eating mindset. Pt states she had a homework assignment was to list 5 things she has control of and 5 things she does not have control of. Pt states mornings are rushed and may not eat breakfast.  Pt's current physical activity: 0, with 0% recommendation reached  Sleep Hygiene: duration and quality: throughout the night 6+ not feeling rested   Current Patient Perceived Stress Level as stated by pt on a scale of 1-10: pt stated 5+       Stress Management Techniques: TV, movies, coloring books   24-Hr Dietary Recall First Meal: up at 5 am, just stayed in bed, sleeping on and off (skip) Snack:  Second Meal: 11: 30 am  personal frozen pizza, tortilla chips, spinach artichoke dip Snack:  Third Meal: 5 or 6  pm personal frozen pizza, Oreos and milk Snack: 10 pm bowl of cereal (asleep at 1 am, but usually 10-11 pm) Beverages: water, sparkling water, mango juice  Alcoholic beverages per week: 0   Estimated Energy Needs Calories: 1800  NUTRITION DIAGNOSIS   Overweight/obesity (Union-3.3) related to past poor dietary habits and physical inactivity as evidenced by patient w/ planned sleeve surgery following dietary guidelines for continued weight loss.  NUTRITION INTERVENTION  Nutrition counseling (C-1) and education (E-2) to facilitate bariatric surgery goals.  Aiming for small, frequent meals throughout the day--including planned snacks--helps keep your energy steady, supports protein goals, and prevents the excessive hunger that can lead to larger portions. One simple strategy is to plan and prep breakfast ahead of time, since mornings are often rushed. Having a ready-to-go option ensures you start the day with balanced nutrition, sets the tone for structured eating, and gives you control over your routine. Practicing these habits now builds consistency and makes it easier to follow the structured eating plan required after bariatric surgery.  Continuing to see a therapist both before and after bariatric surgery is an important part of long-term success. Therapy provides support for the emotional and behavioral changes that come with surgery, helping you prepare for new eating habits, manage stress, and address triggers like emotional eating or food-related coping. After surgery, regular sessions can guide you through adjustment challenges, reinforce healthy routines, and help you stay motivated. Building this support system ensures you're not only physically prepared but also mentally and emotionally equipped for lasting lifestyle change.  Goals Aim for small frequent meals through out the day, including snacks; plan and prep breakfast to save time in the morning (this is something you can control and helps us  avoid excessive hunger and avoid larger meals) Continue seeing therapist  Handouts Previously Provided Include    Learning Style & Readiness for Change Teaching method utilized: Visual, Auditory, and hands on  Demonstrated degree of  understanding via: Teach Back  Readiness Level: Contemplative/action  Barriers to learning/adherence to lifestyle change: Disordered eating patterns  RD's Notes for Next Visit Continue working on relationship with food and possible BED  Goals: Use a Large Language model to help create re frame ideas when you have a negative thought or criticism  Reach out to General dynamics as a possible therapist to work with   MONITORING & EVALUATION Dietary intake, weekly physical activity, body weight, and preoperative behavioral change goals   Next Steps  Patient is to follow up at [facility] for pre surgery follow up in 2 weeks

## 2024-02-02 ENCOUNTER — Ambulatory Visit (INDEPENDENT_AMBULATORY_CARE_PROVIDER_SITE_OTHER): Admitting: Licensed Clinical Social Worker

## 2024-02-02 ENCOUNTER — Encounter (HOSPITAL_COMMUNITY): Payer: Self-pay

## 2024-02-02 ENCOUNTER — Encounter: Payer: Self-pay | Admitting: Nurse Practitioner

## 2024-02-02 DIAGNOSIS — F320 Major depressive disorder, single episode, mild: Secondary | ICD-10-CM

## 2024-02-02 DIAGNOSIS — F909 Attention-deficit hyperactivity disorder, unspecified type: Secondary | ICD-10-CM

## 2024-02-02 DIAGNOSIS — Z91199 Patient's noncompliance with other medical treatment and regimen due to unspecified reason: Secondary | ICD-10-CM

## 2024-02-02 DIAGNOSIS — F419 Anxiety disorder, unspecified: Secondary | ICD-10-CM

## 2024-02-02 NOTE — Progress Notes (Signed)
 Outpatient Therapist Clinician attempted to connect with patient for scheduled virtual appointment via [facility]. Patient was sent link via text and email x 2  Pt did not connect in virtual room--after 15 minutes, clinician closed out Caregility virtual room.   Clinician attempted to connect w/ patient via phone--clinician was not able to leave a voicemail message (there was no option to leave message)

## 2024-02-16 ENCOUNTER — Encounter: Admitting: Skilled Nursing Facility1

## 2024-02-16 ENCOUNTER — Ambulatory Visit (HOSPITAL_COMMUNITY): Admitting: Licensed Clinical Social Worker

## 2024-02-16 ENCOUNTER — Encounter: Payer: Self-pay | Admitting: Skilled Nursing Facility1

## 2024-02-16 VITALS — Ht 66.0 in | Wt >= 6400 oz

## 2024-02-16 DIAGNOSIS — F4320 Adjustment disorder, unspecified: Secondary | ICD-10-CM

## 2024-02-16 DIAGNOSIS — Z6841 Body Mass Index (BMI) 40.0 and over, adult: Secondary | ICD-10-CM | POA: Insufficient documentation

## 2024-02-16 DIAGNOSIS — E66813 Obesity, class 3: Secondary | ICD-10-CM | POA: Insufficient documentation
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
## 2024-02-16 NOTE — Progress Notes (Signed)
 Pre-surgery nutrition visits  Medical Nutrition Therapy  Appt Start Time: 7:30    End Time: 8:00  Patient was seen on 08/27/2023 for Pre-Operative Nutrition Assessment. Purpose of todays visit  enhance perioperative outcomes along with a healthy weight maintenance   Referral stated Supervised Weight Loss (SWL) visits needed: 0  Not cleared at this time:  Pt to follow up for minimum of one visit to assist pt with progressing through stages of change/further nutrition education. RD advised pt that this follow up visit is not mandated through insurance. Pt verbalized agreement.  Patient verbalizes understanding of the importance of addressing disordered eating behaviors prior to bariatric surgery. Patient is agreeable and motivated to continue behavioral work to improve eating habits.  Plan: Continue behavioral change and/or counseling focused on disordered eating patterns and healthy eating habits. Monitor progress closely in preoperative visits. Coordinate care with mental health professionals: (update) pt has been working with a therapist: Dietitian has reached out to her therapist for continuum of care waiting on communication back: Tedi Lakes [phone]  Re-evaluate at each visit starting with 2 more visits as a baseline to add on per evaluation communicating with [facility] once pt is working with a therapist has identifed a remission of Binge eating type episodes  Planned surgery: Sleeve gastrectomy Pt expectation of surgery: to lose weight Pt expectation of dietitian: to assist in understanding her relationship with food   NUTRITION ASSESSMENT   Anthropometrics  Start weight at [facility]: 460 lbs (date: 08/27/2023)  Height: 66 in BMI: 76.42 kg/m2   Weight: 473.5 pounds  Clinical   Pharmacotherapy: History of weight loss medication used: Phentermine, GLP  Medical hx: HTN Medications: see list  Labs:   Notable signs/symptoms: nausea  Any previous deficiencies? No  Evaluation of  Nutritional Deficiencies: Micronutrient Nutrition Focused Physical Exam: Hair: No issues observed Eyes: No issues observed Mouth: No issues observed Neck: No issues observed Nails: No issues observed Skin: No issues observed  Lifestyle & Dietary Hx   Pt states life has been really busy with work and life. 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 Pt states she likes her therapist.  Pt states she had a binge episode about 1.5-2 weeks ago. Pt states she has been keeping cereal and milk at home to ensure she eats breakfast but feel she has not been perfect with his but is working on being okay with that.   Dietitian advised pt once she has a proper foundation with her relationship with food a focus on physical activity and eating pattern goals will begin which pt agrees so she does not get overwhelmed.   Pt's current physical activity: 0, with 0% recommendation reached  Sleep Hygiene: duration and quality: throughout the night 6+ not feeling rested   Current Patient Perceived Stress Level as stated by pt on a scale of 1-10: pt stated 5+       Stress Management Techniques: TV, movies, coloring books   24-Hr Dietary Recall: typically wakes around 5:30-6; at work before 7:30am First Meal: skipped Snack: cereal or protein shake Second Meal: 11: 30 am leftover dinner and salad Snack:  Third Meal: brunswick stew Snack:  Beverages: water, sparkling water, juice  Alcoholic beverages per week: 0   Estimated Energy Needs Calories: 1800  NUTRITION DIAGNOSIS  Overweight/obesity ([place]-3.3) related to past poor dietary habits and physical inactivity as evidenced by patient w/ planned sleeve surgery following dietary guidelines for continued weight loss.  NUTRITION INTERVENTION  Nutrition counseling (C-1) and education (E-2) to  facilitate bariatric surgery goals.  Aiming for small, frequent meals throughout the day--including planned snacks--helps keep your energy steady, supports protein goals,  and prevents the excessive hunger that can lead to larger portions. One simple strategy is to plan and prep breakfast ahead of time, since mornings are often rushed. Having a ready-to-go option ensures you start the day with balanced nutrition, sets the tone for structured eating, and gives you control over your routine. Practicing these habits now builds consistency and makes it easier to follow the structured eating plan required after bariatric surgery.  Continuing to see a therapist both before and after bariatric surgery is an important part of long-term success. Therapy provides support for the emotional and behavioral changes that come with surgery, helping you prepare for new eating habits, manage stress, and address triggers like emotional eating or food-related coping. After surgery, regular sessions can guide you through adjustment challenges, reinforce healthy routines, and help you stay motivated. Building this support system ensures youre not only physically prepared but also mentally and emotionally equipped for lasting lifestyle change.  Goals Continue to work on eating breakfast Continue seeing therapist  Handouts Previously Provided Include    Learning Style & Readiness for Change Teaching method utilized: Visual, Auditory, and hands on  Demonstrated degree of understanding via: Teach Back  Readiness Level: Contemplative/action  Barriers to learning/adherence to lifestyle change: Disordered eating patterns  RD's Notes for Next Visit Continue working on relationship with food and possible BED    MONITORING & EVALUATION Dietary intake, weekly physical activity, body weight, and preoperative behavioral change goals   Next Steps  Patient is to follow up at [facility] for pre surgery follow up in 2 weeks

## 2024-03-02 ENCOUNTER — Encounter: Payer: Self-pay | Admitting: Skilled Nursing Facility1

## 2024-03-02 ENCOUNTER — Encounter: Admitting: Skilled Nursing Facility1

## 2024-03-02 VITALS — Ht 66.0 in | Wt >= 6400 oz

## 2024-03-02 DIAGNOSIS — Z6841 Body Mass Index (BMI) 40.0 and over, adult: Secondary | ICD-10-CM | POA: Diagnosis present

## 2024-03-02 DIAGNOSIS — E66813 Obesity, class 3: Secondary | ICD-10-CM | POA: Insufficient documentation

## 2024-03-02 NOTE — Progress Notes (Signed)
 Pre-surgery nutrition visits  Medical Nutrition Therapy  Appt Start Time: 7:30    End Time: 8:00  Patient was seen on 08/27/2023 for Pre-Operative Nutrition Assessment. Purpose of todays visit  enhance perioperative outcomes along with a healthy weight maintenance   Referral stated Supervised Weight Loss (SWL) visits needed: 0  Not cleared at this time:  Pt to follow up for minimum of one visit to assist pt with progressing through stages of change/further nutrition education. RD advised pt that this follow up visit is not mandated through insurance. Pt verbalized agreement.  Patient verbalizes understanding of the importance of addressing disordered eating behaviors prior to bariatric surgery. Patient is agreeable and motivated to continue behavioral work to improve eating habits.  Plan: Continue behavioral change and/or counseling focused on disordered eating patterns and healthy eating habits. Monitor progress closely in preoperative visits. Coordinate care with mental health professionals: (update) pt has been working with a therapist: Dietitian has reached out to her therapist for continuum of care waiting on communication back: Tedi Lakes [phone]; therapist did not return call advised pt to have her therapist to communicate with this dietitian  Re-evaluate at each visit starting with 2 more visits as a baseline to add on per evaluation communicating with [facility] once pt is working with a therapist has identifed a remission of Binge eating type episodes  Planned surgery: Sleeve gastrectomy Pt expectation of surgery: to lose weight Pt expectation of dietitian: to assist in understanding her relationship with food   NUTRITION ASSESSMENT   Anthropometrics  Start weight at [facility]: 460 lbs (date: 08/27/2023)  Height: 66 in BMI: 76.51 kg/m2   Weight: 474 pounds  Clinical   Pharmacotherapy: History of weight loss medication used: Phentermine, GLP  Medical hx: HTN Medications:  see list  Labs:   Notable signs/symptoms: nausea  Any previous deficiencies? No  Evaluation of Nutritional Deficiencies: Micronutrient Nutrition Focused Physical Exam: Hair: No issues observed Eyes: No issues observed Mouth: No issues observed Neck: No issues observed Nails: No issues observed Skin: No issues observed  Lifestyle & Dietary Hx  Pt states breakfast is going well stating it helps to have something at work such as grits + cheese, oatmeal, and cereal. Pt states since eating breakfast she is not as hungry when she gets home for dinner.   Pt's current physical activity: 0, with 0% recommendation reached  Sleep Hygiene: duration and quality: throughout the night 6+ not feeling rested   Current Patient Perceived Stress Level as stated by pt on a scale of 1-10: pt stated 5+       Stress Management Techniques: TV, movies, coloring books   24-Hr Dietary Recall: typically wakes around 5:30-6; at work before 7:30am First Meal: grits + cheese or oatmeal or cereal or protein shake  Snack: cereal or protein shake Second Meal: 11: 30 am leftover dinner and salad Snack:  Third Meal: brunswick stew Snack:  Beverages: water, sparkling water, juice  Alcoholic beverages per week: 0   Estimated Energy Needs Calories: 1800  NUTRITION DIAGNOSIS  Overweight/obesity ([place]-3.3) related to past poor dietary habits and physical inactivity as evidenced by patient w/ planned sleeve surgery following dietary guidelines for continued weight loss.  NUTRITION INTERVENTION  Nutrition counseling (C-1) and education (E-2) to facilitate bariatric surgery goals.  Aiming for small, frequent meals throughout the day--including planned snacks--helps keep your energy steady, supports protein goals, and prevents the excessive hunger that can lead to larger portions. One simple strategy is to plan and prep breakfast  ahead of time, since mornings are often rushed. Having a ready-to-go option ensures you  start the day with balanced nutrition, sets the tone for structured eating, and gives you control over your routine. Practicing these habits now builds consistency and makes it easier to follow the structured eating plan required after bariatric surgery.  Continuing to see a therapist both before and after bariatric surgery is an important part of long-term success. Therapy provides support for the emotional and behavioral changes that come with surgery, helping you prepare for new eating habits, manage stress, and address triggers like emotional eating or food-related coping. After surgery, regular sessions can guide you through adjustment challenges, reinforce healthy routines, and help you stay motivated. Building this support system ensures youre not only physically prepared but also mentally and emotionally equipped for lasting lifestyle change.  Goals Continue to work on eating breakfast Continue seeing therapist NEW: stand while playing the switch maybe throw in a leg march or some such   Handouts Previously Provided Include    Learning Style & Readiness for Change Teaching method utilized: Visual, Auditory, and hands on  Demonstrated degree of understanding via: Teach Back  Readiness Level: Contemplative/action  Barriers to learning/adherence to lifestyle change: Disordered eating patterns  RD's Notes for Next Visit Continue working on relationship with food and possible BED    MONITORING & EVALUATION Dietary intake, weekly physical activity, body weight, and preoperative behavioral change goals   Next Steps  Patient is to follow up at [facility] for pre surgery follow up in 2 weeks

## 2024-03-09 ENCOUNTER — Encounter: Admitting: Skilled Nursing Facility1

## 2024-03-09 ENCOUNTER — Other Ambulatory Visit: Payer: Self-pay

## 2024-03-10 ENCOUNTER — Encounter: Admitting: Skilled Nursing Facility1

## 2024-03-14 ENCOUNTER — Other Ambulatory Visit (HOSPITAL_COMMUNITY): Payer: Self-pay

## 2024-03-15 ENCOUNTER — Encounter: Attending: Surgery | Admitting: Skilled Nursing Facility1

## 2024-03-15 VITALS — Wt >= 6400 oz

## 2024-03-15 DIAGNOSIS — Z6841 Body Mass Index (BMI) 40.0 and over, adult: Secondary | ICD-10-CM | POA: Diagnosis present

## 2024-03-15 DIAGNOSIS — E66813 Obesity, class 3: Secondary | ICD-10-CM | POA: Diagnosis present

## 2024-03-15 NOTE — Progress Notes (Unsigned)
 Pre-surgery nutrition visits  Medical Nutrition Therapy  Appt Start Time: 3:13    End Time: 3:45  Patient was seen on 08/27/2023 for Pre-Operative Nutrition Assessment. Purpose of todays visit  enhance perioperative outcomes along with a healthy weight maintenance   Referral stated Supervised Weight Loss (SWL) visits needed: 0  Not cleared at this time:  Pt to follow up for minimum of one visit to assist pt with progressing through stages of change/further nutrition education. RD advised pt that this follow up visit is not mandated through insurance. Pt verbalized agreement.  Patient verbalizes understanding of the importance of addressing disordered eating behaviors prior to bariatric surgery. Patient is agreeable and motivated to continue behavioral work to improve eating habits.  Plan: Continue behavioral change and/or counseling focused on disordered eating patterns and healthy eating habits. Monitor progress closely in preoperative visits. Coordinate care with mental health professionals: (update) pt has been working with a therapist: Dietitian has reached out to her therapist for continuum of care waiting on communication back: Debra Walters [phone]; therapist did not return call advised pt to have her therapist to communicate with this dietitian  Re-evaluate at each visit starting with 2 more visits as a baseline to add on per evaluation communicating with [facility] once pt is working with a therapist has identifed a remission of Binge eating type episodes  Planned surgery: Sleeve gastrectomy Pt expectation of surgery: to lose weight Pt expectation of dietitian: to assist in understanding her relationship with food   NUTRITION ASSESSMENT   Anthropometrics  Start weight at [facility]: 460 lbs (date: 08/27/2023)  Height: 66 in BMI: 76.51 kg/m2   Weight: 474 pounds  Clinical   Pharmacotherapy: History of weight loss medication used: Phentermine, GLP  Medical hx: HTN Medications:  see list  Labs:   Notable signs/symptoms: nausea  Any previous deficiencies? No   Lifestyle & Dietary Hx  Pt states work is stressful. Received promotion and a new system is being utilized. Pt states she is dealing with stress better than normal. She states she feels used to the work stress and is not alone therefore it helps with it.  Pt states her relationship with food has been fine. She states she has had binge episodes but has been able to catch it.   Most recent binge eating episode, pt states she did not order [employer] (a win) and she caught herself at the full feeling. Pt states she saw that she was having an episode when she sat down and saw her portion. She was not focused on what she was doing or eating but then her concentration broke from the distraction and she saw how much she had ate. Pt told herself to put the bowl down. Pt states she was not being nice to herself in that moment but it worked.  Pt states she did not congratulate herself for catching her binge eating episode until this session. Pt states she felt guilt and shame but did not dwell in it.   Pt states that she has been struggling mentally and cleaned her whole house. Worked on mental shift from having to. Pt did not feel overwhelmed with this task. Pt states she felt anxiety in finding a therapist because she did not want to hurt anyones feelings so she stayed with therapists longer than she should have. Pt states her current therapist is clear and set accomplishable goals. Pt states she believes this will help her feel and do better.  Pt has been standing up to  play the switch and doing marches. Pt states it felt okay and did not feel like a big add.  24-Hr Dietary Recall: typically wakes around 5:30-6; at work before 7:30am First Meal: cucumbers and cottage cheese Snack:  Second Meal: leftover lasagna and strawberry dump cake Snack:  Third Meal: salad and tomato sauce/pasta Snack: tres leches  cake Beverages: lemon water  Alcoholic beverages per week: 0   Estimated Energy Needs Calories: 1800  NUTRITION DIAGNOSIS   NUTRITION INTERVENTION   Goals Continue to work on eating breakfast Continue seeing therapist Continue standing while playing the switch maybe throw in a leg march or some such  NEW: Pick 2 meals per day to eat without distractions. Note what you are thinking and feeling and try to talk about it in therapy.   Handouts Previously Provided Include    Learning Style & Readiness for Change Teaching method utilized: Visual, Auditory, and hands on  Demonstrated degree of understanding via: Goal Setting Readiness Level: Contemplative/action  Barriers to learning/adherence to lifestyle change: Disordered eating patterns  RD's Notes for Next Visit Continue working on relationship with food and possible BED    MONITORING & EVALUATION Dietary intake, weekly physical activity, body weight, and preoperative behavioral change goals   Next Steps  Patient is to follow up at [facility] for pre surgery follow up in 1 week.

## 2024-03-22 ENCOUNTER — Encounter: Payer: Self-pay | Admitting: Skilled Nursing Facility1

## 2024-03-22 ENCOUNTER — Encounter: Attending: Surgery | Admitting: Skilled Nursing Facility1

## 2024-03-22 VITALS — Ht 66.0 in | Wt >= 6400 oz

## 2024-03-22 DIAGNOSIS — Z6841 Body Mass Index (BMI) 40.0 and over, adult: Secondary | ICD-10-CM | POA: Insufficient documentation

## 2024-03-22 DIAGNOSIS — E66813 Obesity, class 3: Secondary | ICD-10-CM | POA: Insufficient documentation

## 2024-03-22 NOTE — Progress Notes (Signed)
 Pre-surgery nutrition visits  Medical Nutrition Therapy  Appt Start Time: 7:25    End Time: 7:55  Patient was seen on 08/27/2023 for Pre-Operative Nutrition Assessment. Purpose of todays visit  enhance perioperative outcomes along with a healthy weight maintenance   Referral stated Supervised Weight Loss (SWL) visits needed: 0  Not cleared at this time:  Pt to follow up for minimum of one visit to assist pt with progressing through stages of change/further nutrition education. RD advised pt that this follow up visit is not mandated through insurance. Pt verbalized agreement.  Patient verbalizes understanding of the importance of addressing disordered eating behaviors prior to bariatric surgery. Patient is agreeable and motivated to continue behavioral work to improve eating habits.  Plan: Continue behavioral change and/or counseling focused on disordered eating patterns and healthy eating habits. Monitor progress closely in preoperative visits. Coordinate care with mental health professionals: (update) pt has been working with a therapist: Dietitian has reached out to her therapist for continuum of care waiting on communication back: Tedi Lakes [phone]; therapist did not return call advised pt to have her therapist to communicate with this dietitian  Re-evaluate at each visit starting with 2 more visits as a baseline to add on per evaluation communicating with [facility] once pt is working with a therapist has identifed a remission of Binge eating type episodes    Planned surgery: Sleeve gastrectomy Pt expectation of surgery: to lose weight Pt expectation of dietitian: to assist in understanding her relationship with food   NUTRITION ASSESSMENT   Anthropometrics  Start weight at [facility]: 460 lbs (date: 08/27/2023)  Height: 66 in BMI: 75.05 kg/m2   Weight: 465 pounds  Clinical   Pharmacotherapy: History of weight loss medication used: Phentermine, GLP  Medical hx:  HTN Medications: see list  Labs:   Notable signs/symptoms: nausea  Any previous deficiencies? No   Lifestyle & Dietary Hx  Pt arrives having lost 10 pounds in 7 days: after some conversation it seems this could have been due to her starting to take her hydrochlorothiazide  as prescribed.    Pt states realized she was letting a lot of life stuff go like hair maintainance and cleaning the house and taking her medicaitons. Pt states in trying to eat without any distractions she made it 15 minutes the first time and then the second time was a bit better recognizing she did not eat as fast.   24-Hr Dietary Recall: typically wakes around 5:30-6; at work before 7:30am First Meal: cucumbers and cottage cheese Snack:  Second Meal: leftover lasagna and strawberry dump cake Snack:  Third Meal: salad and tomato sauce/pasta Snack: tres leches cake Beverages: lemon water  Alcoholic beverages per week: 0   Estimated Energy Needs Calories: 1800  NUTRITION DIAGNOSIS   NUTRITION INTERVENTION   Goals Continue to work on eating breakfast Continue seeing therapist Continue standing while playing the switch maybe throw in a leg march or some such  Continue/NEW: Pick 2 meals per day to eat without distractions. Note what you are thinking and feeling and try to talk about it in therapy.  NEW: wake at 7am on the weekends  NEW: find something fun to do for those GAINED 3 hours   Handouts Previously Provided Include    Learning Style & Readiness for Change Teaching method utilized: Visual, Auditory, and hands on  Demonstrated degree of understanding via: Goal Setting Readiness Level: Contemplative/action  Barriers to learning/adherence to lifestyle change: Disordered eating patterns  RD's Notes for Next Visit  Continue working on relationship with food and possible BED    MONITORING & EVALUATION Dietary intake, weekly physical activity, body weight, and preoperative behavioral change goals    Next Steps  Patient is to follow up at [facility] for pre surgery follow up in 1 week.

## 2024-03-30 ENCOUNTER — Encounter: Payer: Self-pay | Admitting: Skilled Nursing Facility1

## 2024-03-30 ENCOUNTER — Encounter: Admitting: Skilled Nursing Facility1

## 2024-03-30 VITALS — Ht 66.0 in | Wt >= 6400 oz

## 2024-03-30 DIAGNOSIS — E66813 Obesity, class 3: Secondary | ICD-10-CM | POA: Diagnosis present

## 2024-03-30 DIAGNOSIS — Z6841 Body Mass Index (BMI) 40.0 and over, adult: Secondary | ICD-10-CM | POA: Diagnosis present

## 2024-03-30 NOTE — Progress Notes (Signed)
 Pre-surgery nutrition visits  Medical Nutrition Therapy  Appt Start Time: 7:31    End Time: 7:55  Patient was seen on 08/27/2023 for Pre-Operative Nutrition Assessment. Purpose of todays visit  enhance perioperative outcomes along with a healthy weight maintenance   Referral stated Supervised Weight Loss (SWL) visits needed: 0  Not cleared at this time:  Pt to follow up for minimum of one visit to assist pt with progressing through stages of change/further nutrition education. RD advised pt that this follow up visit is not mandated through insurance. Pt verbalized agreement.  Patient verbalizes understanding of the importance of addressing disordered eating behaviors prior to bariatric surgery. Patient is agreeable and motivated to continue behavioral work to improve eating habits.  Plan: Continue behavioral change and/or counseling focused on disordered eating patterns and healthy eating habits. Monitor progress closely in preoperative visits. Coordinate care with mental health professionals: (update) pt has been working with a therapist: Dietitian has reached out to her therapist for continuum of care waiting on communication back: Debra Walters [phone]; therapist did not return call advised pt to have her therapist to communicate with this dietitian  Re-evaluate at each visit starting with 2 more visits as a baseline to add on per evaluation communicating with [facility] once pt is working with a therapist has identifed a remission of Binge eating type episodes    Planned surgery: Sleeve gastrectomy Pt expectation of surgery: to lose weight Pt expectation of dietitian: to assist in understanding her relationship with food   NUTRITION ASSESSMENT   Anthropometrics  Start weight at [facility]: 460 lbs (date: 08/27/2023)  Height: 66 in BMI: 75.86 kg/m2   Weight: 470 pounds  Clinical   Pharmacotherapy: History of weight loss medication used: Phentermine, GLP  Medical hx:  HTN Medications: see list  Labs:   Notable signs/symptoms: nausea  Any previous deficiencies? No   Lifestyle & Dietary Hx  Pt states she has opportunities to binge and reasons to binge due to her birthday plans being derailed instead buying herself a craft statistician).  Pt states she did wake at 7am realizing she typically was up at 7am but did not realize she would doom scroll until 12.  Pt states she has been taking a break at work and actually having a lunch.   24-Hr Dietary Recall: typically wakes around 5:30-6; at work before 7:30am First Meal 7:30: greek yogurt and grapes  Snack:  Second Meal: chips and salsa and sour cream  Snack:  Third Meal: chicken sasuage and mac n cheese  Snack: gummy bears and stroop waffles  Beverages: lemon water  Alcoholic beverages per week: 0   Estimated Energy Needs Calories: 1800  NUTRITION DIAGNOSIS   NUTRITION INTERVENTION   Goals Continue to work on eating breakfast Continue seeing therapist Continue standing while playing the switch maybe throw in a leg march or some such  Continue/NEW: Pick 2 meals per day to eat without distractions. Note what you are thinking and feeling and try to talk about it in therapy.  Continue: wake at 7am on the weekends  Continue: find something fun to do for those GAINED 3 hours  NEW: Eat breakfast on the weekends around 8:30: even just a banana  NEW: do not drink 15 minutes before you eat, nothing with your meal and wait 30 minutes after you eat before you drink again   Handouts Previously Provided Include    Learning Style & Readiness for Change Teaching method utilized: Visual, Auditory, and hands on  Demonstrated degree of understanding via: Goal Setting Readiness Level: Contemplative/action  Barriers to learning/adherence to lifestyle change: Disordered eating patterns  RD's Notes for Next Visit Continue working on relationship with food and possible BED    MONITORING &  EVALUATION Dietary intake, weekly physical activity, body weight, and preoperative behavioral change goals   Next Steps  Patient is to follow up at [facility] for pre surgery follow up in 1 week.

## 2024-04-11 ENCOUNTER — Encounter: Admitting: Skilled Nursing Facility1
